# Patient Record
Sex: Male | Born: 1980 | Race: White | Hispanic: Yes | Marital: Single | Smoking: Never smoker
Health system: Southern US, Community
[De-identification: ages and names within clinical notes are randomized; demographics above are authoritative.]

## PROBLEM LIST (undated history)

## (undated) HISTORY — PX: KNEE SURGERY: SHX244

---

## 2007-07-24 ENCOUNTER — Emergency Department (HOSPITAL_COMMUNITY): Admission: EM | Admit: 2007-07-24 | Discharge: 2007-07-24 | Payer: Self-pay | Admitting: Emergency Medicine

## 2009-08-05 ENCOUNTER — Emergency Department (HOSPITAL_COMMUNITY): Admission: EM | Admit: 2009-08-05 | Discharge: 2009-08-05 | Payer: Self-pay | Admitting: Emergency Medicine

## 2011-04-30 ENCOUNTER — Emergency Department (HOSPITAL_COMMUNITY): Payer: Worker's Compensation

## 2011-04-30 ENCOUNTER — Emergency Department (HOSPITAL_COMMUNITY)
Admission: EM | Admit: 2011-04-30 | Discharge: 2011-05-01 | Disposition: A | Discharge status: Home / Self Care | Payer: Worker's Compensation | Attending: Emergency Medicine | Admitting: Emergency Medicine

## 2011-04-30 DIAGNOSIS — IMO0002 Reserved for concepts with insufficient information to code with codable children: Secondary | ICD-10-CM | POA: Insufficient documentation

## 2011-04-30 DIAGNOSIS — M25569 Pain in unspecified knee: Secondary | ICD-10-CM | POA: Insufficient documentation

## 2011-04-30 DIAGNOSIS — S70259A Superficial foreign body, unspecified hip, initial encounter: Secondary | ICD-10-CM | POA: Insufficient documentation

## 2011-04-30 DIAGNOSIS — S8990XA Unspecified injury of unspecified lower leg, initial encounter: Secondary | ICD-10-CM | POA: Insufficient documentation

## 2011-04-30 LAB — DIFFERENTIAL
Basophils Absolute: 0 10*3/uL (ref 0.0–0.1)
Eosinophils Absolute: 0.1 10*3/uL (ref 0.0–0.7)
Eosinophils Relative: 2 % (ref 0–5)
Monocytes Absolute: 0.4 10*3/uL (ref 0.1–1.0)

## 2011-04-30 LAB — CBC
HCT: 39.7 % (ref 39.0–52.0)
MCHC: 34.5 g/dL (ref 30.0–36.0)
MCV: 84.8 fL (ref 78.0–100.0)
Platelets: 203 10*3/uL (ref 150–400)
RDW: 13.2 % (ref 11.5–15.5)
WBC: 7.9 10*3/uL (ref 4.0–10.5)

## 2011-05-01 ENCOUNTER — Emergency Department (HOSPITAL_COMMUNITY): Payer: Worker's Compensation

## 2011-05-23 NOTE — Consult Note (Signed)
  NAMEJAMY, CLECKLER NO.:  [id number]  MEDICAL RECORD NO.:  [id number]  LOCATION:  [facility]                         FACILITY:  [facility]  PHYSICIAN:  Burnard Bunting, M.D.    DATE OF BIRTH:  1981/04/03  DATE OF CONSULTATION:  04/30/2011 DATE OF DISCHARGE:                                CONSULTATION   CHIEF COMPLAINT:  Right knee pain.  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 Denies any other orthopedic complaints.  His tetanus status is unknown, but it is provided in the emergency room.  PAST MEDICAL AND SURGICAL HISTORY:  Negative.  The patient speaks very little [place].  This communication effort is complicated today, need for translator who is here in the emergency room.  CURRENT MEDICATIONS:  None.  ALLERGIES:  None.  All systems reviewed are negative as they relate to the right knee. PHYSICAL EXAMINATION:  GENERAL:  He is well developed, well-nourished in no acute distress, alert and oriented. VITAL SIGNS:  Blood pressure 118/80, pulse 63, respirations 16. CHEST:  Clear to auscultation. CARDIAC:  Heart beats regular rate and rhythm. ABDOMEN:  Benign. EXTREMITIES:  Right knee stapled at the superior pole of patella.  The transverse portion of staple is visible.  Patella is nonmobile.  There is no knee effusion.  Collateral cruciate ligament stable.  Pedal pulses are intact.  Compartments are soft and leg.  Radiographs show a foreign body.  IMPRESSION:  Right knee foreign body likely implanted superior pole of patella.  Extensor mechanism does appear to be intact.  Attempt was made in the emergency room to remove without success.  Plan is for operative debridement, Ancef 2 g given in the emergency room.  All questions answered.  Medical decision-making is complicated by decision for  surgery as well as a communication barrier.     Burnard Bunting, M.D.     GSD/MEDQ  D:  04/30/2011  T:  05/01/2011  Job:  [id number]  cc:   Trudi Ida. Denton Lank, M.D.  Electronically Signed by Reece Agar.  Izzah Pasqua M.D. on 05/23/2011 05:25:45 PM

## 2011-05-23 NOTE — Op Note (Signed)
  NAMEMAZE, CORNIEL NO.:  [id number]  MEDICAL RECORD NO.:  [id number]  LOCATION:  [facility]                         FACILITY:  [facility]  PHYSICIAN:  Burnard Bunting, M.D.    DATE OF BIRTH:  April 24, 1981  DATE OF PROCEDURE:  05/01/2011 DATE OF DISCHARGE:  05/01/2011                              OPERATIVE REPORT   PREOPERATIVE DIAGNOSIS:  Right knee foreign body with intra-articular penetration.  POSTOPERATIVE DIAGNOSIS:  Right knee foreign body with intra-articular penetration.  PROCEDURE:  Right knee removal of foreign body; excisional debridement of skin, subcu tissue, muscle fascia, bone from penetrating injury to superior aspect of the patella as well as irrigation and debridement and washout of the knee joint.  SURGEON:  Burnard Bunting, MD  ASSISTANT:  None.  ANESTHESIA:  General endotracheal.  ESTIMATED BLOOD LOSS:  Minimal.  INDICATIONS:  Tristan Owens is a 30 year old patient with right knee pain.  He shot himself with a staple gun today in the superior aspect of the knee.  He presents now for operative management.  He did receive tetanus and IV antibiotics in the emergency room.  PROCEDURE IN DETAIL:  The patient was brought to the operating room where general endotracheal anesthesia was induced.  Preoperative IV antibiotics were administered.  Leg area was prescrubbed with alcohol and Betadine and prepped with DuraPrep solution including a foot drape in a sterile manner.  Time-out was called.  Skin incision was made around the nail.  The staple was embedded within the patella.  This took some force to remove.  It was removed in total.  At this time, using a curette and sharp dissection, the path of the staple was debrided. Thorough irrigation was then performed on this incision which was closed loosely at the end of the case using 3-0 nylon suture.  An aspiration of the knee joint effusion was performed.  It was bloody, therefore  intra- articular penetration had occurred.  Two arthroscopic portals were created superomedial and superolateral.  Three liters of irrigating solution were then used to irrigate out the knee.  These portals were closed using 3-0 nylon.  At this time, the knee was stable.  It was wrapped with a bulky dressing. He tolerated the procedure without immediate complication.     Burnard Bunting, M.D.     GSD/MEDQ  D:  05/01/2011  T:  05/01/2011  Job:  [id number]  Electronically Signed by Reece Agar.  DEAN M.D. on 05/23/2011 05:25:49 PM

## 2011-06-13 ENCOUNTER — Ambulatory Visit: Payer: Self-pay | Attending: Orthopedic Surgery | Admitting: Physical Therapy

## 2011-06-13 DIAGNOSIS — IMO0001 Reserved for inherently not codable concepts without codable children: Secondary | ICD-10-CM | POA: Insufficient documentation

## 2011-06-13 DIAGNOSIS — M25569 Pain in unspecified knee: Secondary | ICD-10-CM | POA: Insufficient documentation

## 2011-06-13 DIAGNOSIS — M25669 Stiffness of unspecified knee, not elsewhere classified: Secondary | ICD-10-CM | POA: Insufficient documentation

## 2011-06-25 ENCOUNTER — Ambulatory Visit: Payer: Self-pay | Admitting: Physical Therapy

## 2011-06-26 ENCOUNTER — Ambulatory Visit: Payer: Self-pay | Admitting: Physical Therapy

## 2011-07-07 ENCOUNTER — Ambulatory Visit: Payer: Self-pay | Admitting: Physical Therapy

## 2011-07-10 ENCOUNTER — Ambulatory Visit: Payer: Self-pay | Admitting: Physical Therapy

## 2011-07-15 ENCOUNTER — Ambulatory Visit: Payer: Worker's Compensation | Attending: Orthopedic Surgery | Admitting: Physical Therapy

## 2011-07-15 DIAGNOSIS — M25669 Stiffness of unspecified knee, not elsewhere classified: Secondary | ICD-10-CM | POA: Insufficient documentation

## 2011-07-15 DIAGNOSIS — IMO0001 Reserved for inherently not codable concepts without codable children: Secondary | ICD-10-CM | POA: Insufficient documentation

## 2011-07-15 DIAGNOSIS — M25569 Pain in unspecified knee: Secondary | ICD-10-CM | POA: Insufficient documentation

## 2011-07-17 ENCOUNTER — Ambulatory Visit: Payer: Worker's Compensation | Admitting: Physical Therapy

## 2011-07-21 ENCOUNTER — Ambulatory Visit: Payer: Worker's Compensation | Admitting: Physical Therapy

## 2011-07-23 ENCOUNTER — Encounter: Payer: Self-pay | Admitting: Physical Therapy

## 2011-07-28 ENCOUNTER — Ambulatory Visit: Payer: Worker's Compensation | Admitting: Physical Therapy

## 2011-07-30 ENCOUNTER — Ambulatory Visit: Payer: Worker's Compensation | Admitting: Physical Therapy

## 2011-08-05 ENCOUNTER — Ambulatory Visit: Payer: Worker's Compensation | Admitting: Physical Therapy

## 2011-11-02 IMAGING — CR DG CHEST 2V
1 series · 1 of 1 positions shown · non-contrast
Comparison: None

CLINICAL DATA: Preoperative assessment for foreign body removal
from the knee; patient does not speak English, unable to obtain
additional history

CHEST - 2 VIEW

[view not recorded]
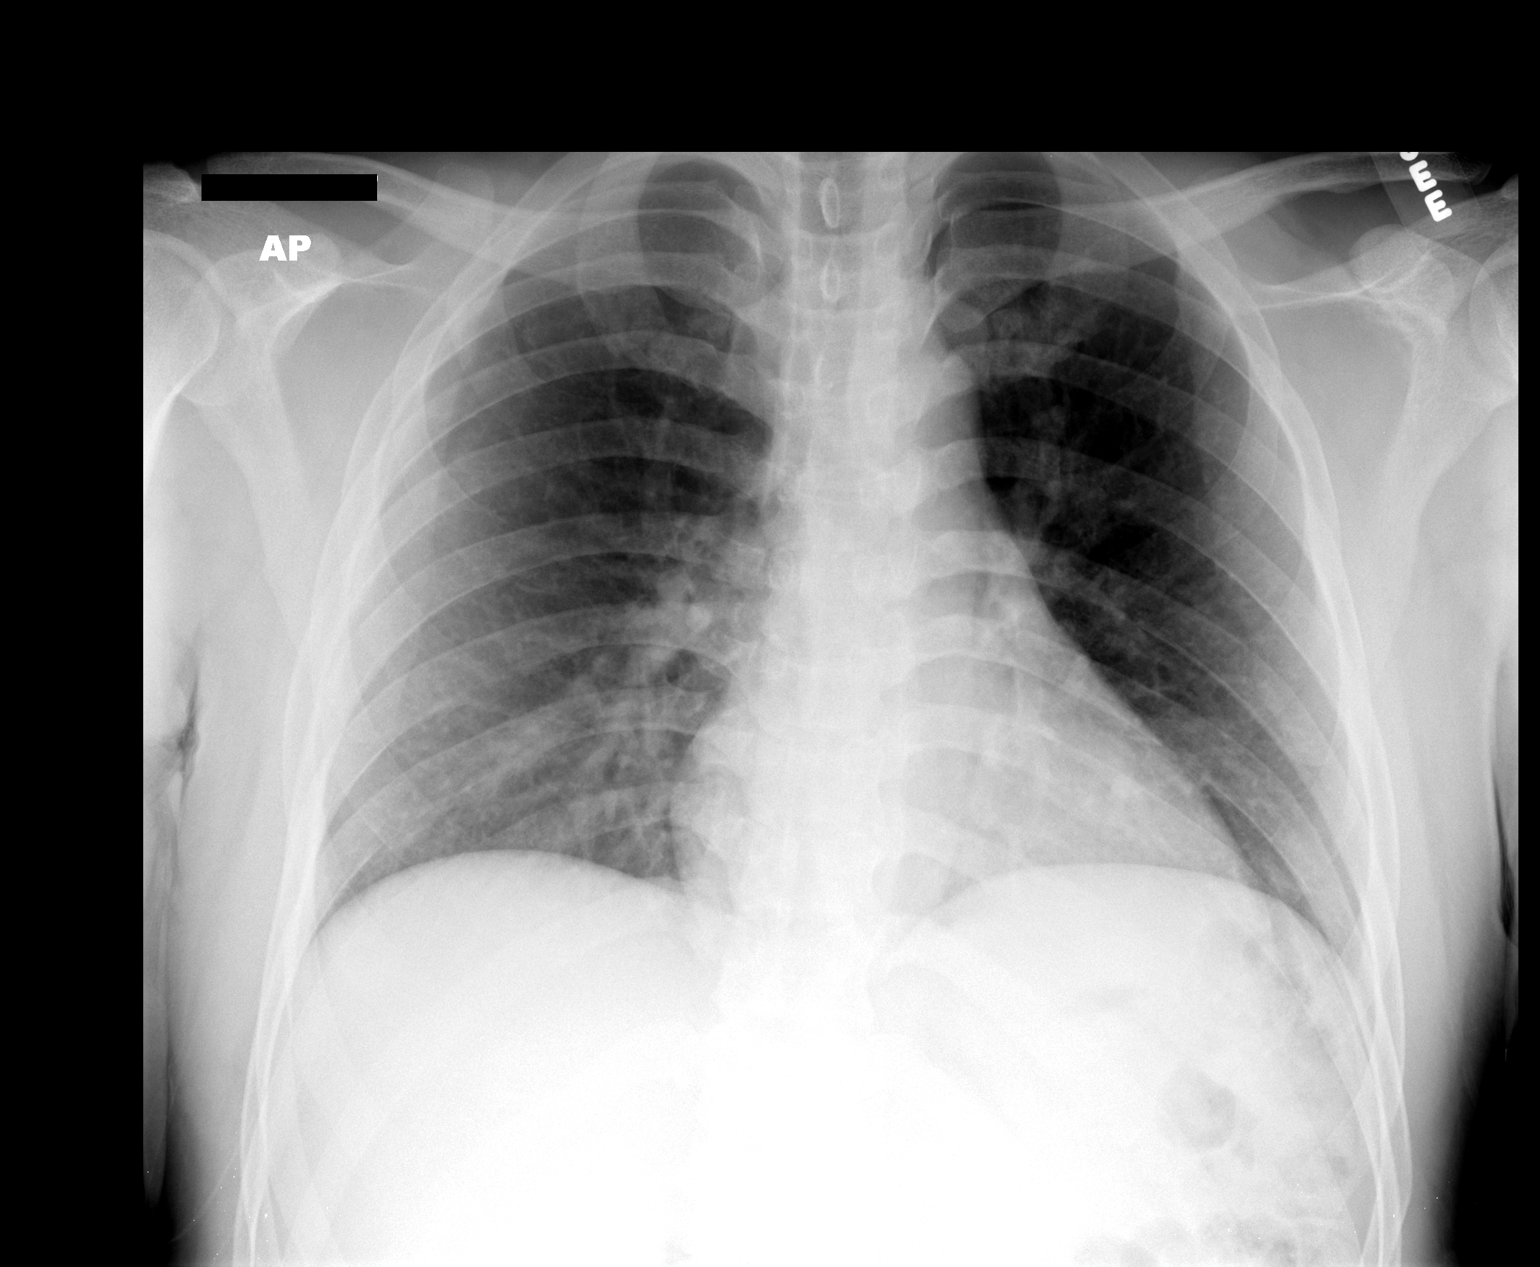

[1 of 1 positions shown; findings below may reference images not displayed]

FINDINGS: Normal heart size, mediastinal contours, pulmonary vascularity for
technique.
Questionable subtle right base infiltrate versus atelectasis.
Remaining lungs clear.
No pleural effusion or pneumothorax.
Bones unremarkable.
IMPRESSION: Questionable mild atelectasis or infiltrate right base.

## 2011-11-03 IMAGING — CR DG KNEE 1-2V PORT*R*
2 series · 2 of 2 positions shown · non-contrast
Comparison: Plain films of the right knee earlier this same date.

CLINICAL DATA: Status post removal of foreign body.

PORTABLE RIGHT KNEE - 1-2 VIEW

[ap/obl knee]
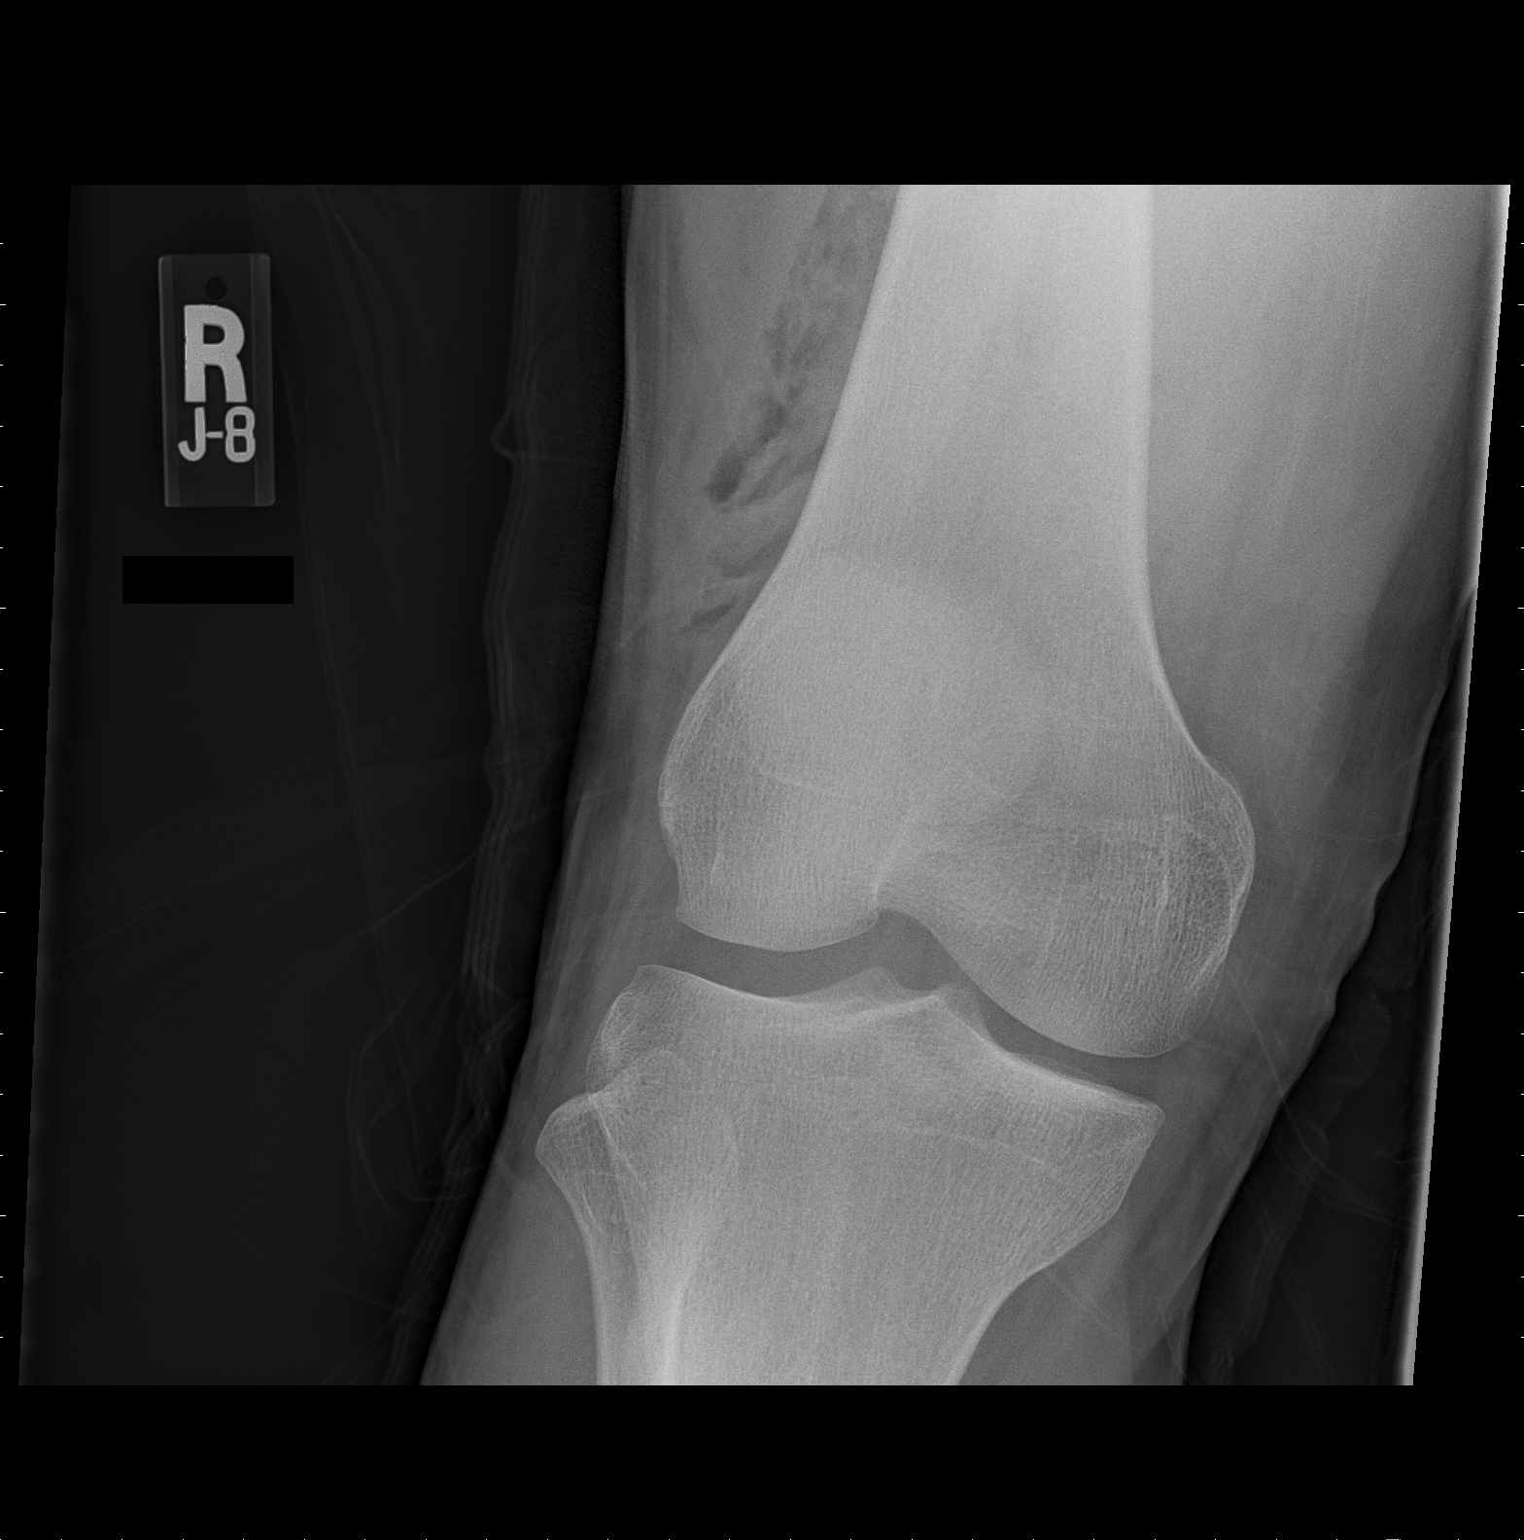

[knee lat]
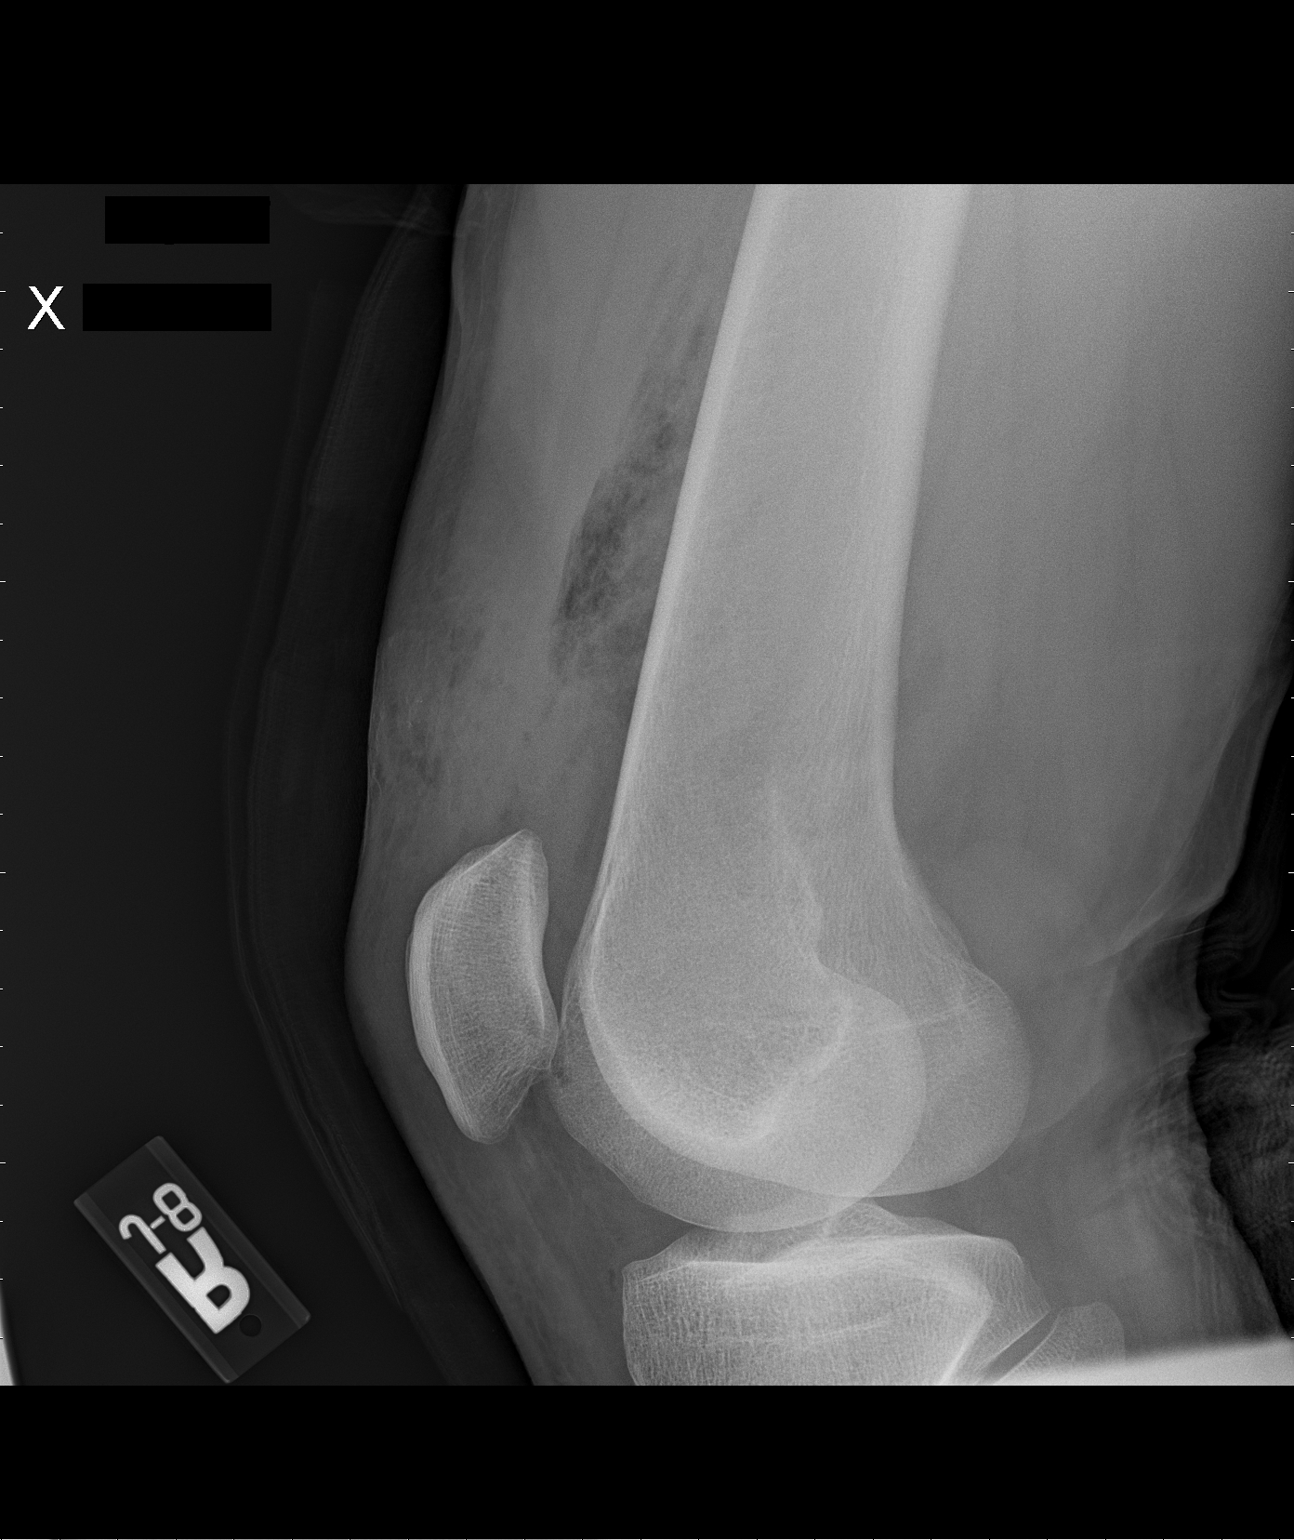

[2 of 2 positions shown; findings below may reference images not displayed]

FINDINGS: There is gas in the soft tissues from surgery.
Previously seen radiopaque foreign body superior to the patella is
no longer visualized.  No retained foreign body is seen.  No
fracture.
IMPRESSION: Removal of foreign body.  No acute abnormality.

## 2013-05-14 ENCOUNTER — Encounter (HOSPITAL_COMMUNITY): Payer: Self-pay | Admitting: Emergency Medicine

## 2013-05-14 ENCOUNTER — Emergency Department (HOSPITAL_COMMUNITY)
Admission: EM | Admit: 2013-05-14 | Discharge: 2013-05-14 | Disposition: A | Discharge status: Home / Self Care | Payer: Self-pay | Attending: Emergency Medicine | Admitting: Emergency Medicine

## 2013-05-14 DIAGNOSIS — Z79899 Other long term (current) drug therapy: Secondary | ICD-10-CM | POA: Insufficient documentation

## 2013-05-14 DIAGNOSIS — H9202 Otalgia, left ear: Secondary | ICD-10-CM

## 2013-05-14 DIAGNOSIS — R05 Cough: Secondary | ICD-10-CM | POA: Insufficient documentation

## 2013-05-14 DIAGNOSIS — J029 Acute pharyngitis, unspecified: Secondary | ICD-10-CM | POA: Insufficient documentation

## 2013-05-14 DIAGNOSIS — H9209 Otalgia, unspecified ear: Secondary | ICD-10-CM | POA: Insufficient documentation

## 2013-05-14 DIAGNOSIS — R059 Cough, unspecified: Secondary | ICD-10-CM | POA: Insufficient documentation

## 2013-05-14 MED ORDER — CETIRIZINE-PSEUDOEPHEDRINE ER 5-120 MG PO TB12: 1.0000 | ORAL_TABLET | Freq: Two times a day (BID) | ORAL | Status: AC

## 2013-05-14 MED ORDER — AMOXICILLIN 500 MG PO CAPS: 500.0000 mg | ORAL_CAPSULE | Freq: Two times a day (BID) | ORAL | Status: AC

## 2013-05-14 NOTE — ED Notes (Signed)
LEft Ear pain started on Wednesday. Throat aching and cannot hear out of the left ear.

## 2013-05-14 NOTE — ED Provider Notes (Signed)
History    CSN: [id number] Arrival date & time 05/14/13  4098  First MD Initiated Contact with Patient 05/14/13 0831     Chief Complaint  Patient presents with  . Otalgia   (Consider location/radiation/quality/duration/timing/severity/associated sxs/prior Treatment) HPI Comments: Patient presents with 3-4 days of sore throat and left ear pain. No fever or head injury. No congestion or runny nose. Patient has had cough. No nausea , vomiting, diarrhea. Patient has had Robitussin prior to arrival. No other treatments. No significant past medical history. Onset of symptoms gradual. Course is constant. Nothing makes symptoms better or worse.  The history is provided by the patient and a friend. A language interpreter was used.   No past medical history on file. No past surgical history on file. No family history on file. History  Substance Use Topics  . Smoking status: Never Smoker   . Smokeless tobacco: Not on file  . Alcohol Use: 7.2 oz/week    12 Cans of beer per week    Review of Systems  Constitutional: Negative for fever, chills and fatigue.  HENT: Positive for ear pain and sore throat. Negative for congestion, rhinorrhea, neck stiffness and sinus pressure.   Eyes: Negative for redness.  Respiratory: Positive for cough. Negative for wheezing.   Gastrointestinal: Negative for nausea, vomiting, abdominal pain and diarrhea.  Genitourinary: Negative for dysuria.  Musculoskeletal: Negative for myalgias.  Skin: Negative for rash.  Neurological: Negative for headaches.  Hematological: Negative for adenopathy.    Allergies  Review of patient's allergies indicates no known allergies.  Home Medications   Current Outpatient Rx  Name  Route  Sig  Dispense  Refill  . Pseudoephedrine-DM-GG (ROBITUSSIN CF PO)   Oral   Take by mouth every 6 (six) hours as needed.          BP 119/77  Pulse 75  Temp(Src) 98.4 F (36.9 C) (Oral)  Wt 184 lb (83.462 kg)  SpO2 96% Physical  Exam  Nursing note and vitals reviewed. Constitutional: He appears well-developed and well-nourished.  HENT:  Head: Normocephalic and atraumatic.  Right Ear: Tympanic membrane, external ear and ear canal normal. Tympanic membrane is not injected.  Left Ear: External ear and ear canal normal. Tympanic membrane is injected (mild, pink) and retracted (mild). Tympanic membrane is not bulging.  Mouth/Throat: Mucous membranes are normal. No edematous. Posterior oropharyngeal erythema present. No oropharyngeal exudate, posterior oropharyngeal edema or tonsillar abscesses.  Eyes: Conjunctivae are normal. Right eye exhibits no discharge. Left eye exhibits no discharge.  Neck: Normal range of motion. Neck supple.  Cardiovascular: Normal rate, regular rhythm and normal heart sounds.   Pulmonary/Chest: Effort normal and breath sounds normal.  Abdominal: Soft. There is no tenderness.  Neurological: He is alert.  Skin: Skin is warm and dry.  Psychiatric: He has a normal mood and affect.    ED Course  Procedures (including critical care time) Labs Reviewed - No data to display No results found. 1. Otalgia of left ear     8:50 AM Patient seen and examined.   Vital signs reviewed and are as follows: Filed Vitals:   05/14/13 0832  BP: 119/77  Pulse: 75  Temp: 98.4 F (36.9 C)   Pt counseled on wait and see regarding antibiotics. Encouraged to use decongestants for several days and monitor for improvement.   Patient urged to return with worsening symptoms or other concerns. Patient verbalized understanding and agrees with plan.    MDM  Ear pain: mild retraction of  TM, mild pink coloration of TM. Might indicate early otitis media but eustachian tube dysfunction in setting of viral illness (sore throat) might cause this as well. Encouraged decongestants prior to filling abx. ENT referral if no improvement.   Renne Crigler, PA-C 05/14/13 [phone]

## 2013-05-14 NOTE — ED Provider Notes (Signed)
Medical screening examination/treatment/procedure(s) were performed by non-physician practitioner and as supervising physician I was immediately available for consultation/collaboration.   Johnnette Laux L Maryjane Benedict, MD 05/14/13 2046 

## 2013-11-30 ENCOUNTER — Emergency Department (HOSPITAL_COMMUNITY)
Admission: EM | Admit: 2013-11-30 | Discharge: 2013-11-30 | Disposition: A | Discharge status: Home / Self Care | Payer: Worker's Compensation | Attending: Emergency Medicine | Admitting: Emergency Medicine

## 2013-11-30 ENCOUNTER — Encounter (HOSPITAL_COMMUNITY): Payer: Self-pay | Admitting: Emergency Medicine

## 2013-11-30 DIAGNOSIS — W540XXA Bitten by dog, initial encounter: Secondary | ICD-10-CM | POA: Insufficient documentation

## 2013-11-30 DIAGNOSIS — S61459A Open bite of unspecified hand, initial encounter: Secondary | ICD-10-CM

## 2013-11-30 DIAGNOSIS — Y9389 Activity, other specified: Secondary | ICD-10-CM | POA: Insufficient documentation

## 2013-11-30 DIAGNOSIS — S61209A Unspecified open wound of unspecified finger without damage to nail, initial encounter: Secondary | ICD-10-CM | POA: Insufficient documentation

## 2013-11-30 DIAGNOSIS — Z79899 Other long term (current) drug therapy: Secondary | ICD-10-CM | POA: Insufficient documentation

## 2013-11-30 DIAGNOSIS — Y929 Unspecified place or not applicable: Secondary | ICD-10-CM | POA: Insufficient documentation

## 2013-11-30 MED ORDER — TETANUS-DIPHTH-ACELL PERTUSSIS 5-2.5-18.5 LF-MCG/0.5 IM SUSP
0.5000 mL | Freq: Once | INTRAMUSCULAR | Status: DC
  Filled 2013-11-30: qty 0.5

## 2013-11-30 MED ORDER — AMOXICILLIN-POT CLAVULANATE 875-125 MG PO TABS: 1.0000 | ORAL_TABLET | Freq: Two times a day (BID) | ORAL | Status: AC

## 2013-11-30 NOTE — ED Notes (Signed)
Patient has an animal bite to his left index finger at the knuckle. No bleeding at this time. Swollen and red.

## 2013-11-30 NOTE — ED Provider Notes (Signed)
Medical screening examination/treatment/procedure(s) were performed by non-physician practitioner and as supervising physician I was immediately available for consultation/collaboration.      Celene KrasJon R Kitai Purdom, MD 11/30/13 [phone]

## 2013-11-30 NOTE — ED Provider Notes (Signed)
CSN: [id number]     Arrival date & time 11/30/13  1049 History  This chart was scribed for non-physician practitioner Santiago Glad, PA-C working with Celene Kras, MD by Dorothey Baseman, ED Scribe. This patient was seen in room TR05C/TR05C and the patient's care was started at 11:47 AM.    Chief Complaint  Patient presents with  . Animal Bite   The history is provided by the patient. The history is limited by a language barrier. No language interpreter was used.   HPI Comments: Tristan Owens is a 33 y.o. Male who presents to the Emergency Department complaining of a dog bite to the left index finger and thumb that occurred earlier today. He states that he was bitten by his neighbor's dog when it attacked his own dog and he attempted to break them up. Patient reports a constant pain to the area with associated swelling and laceration. There is no active bleeding at this time. He states that he does not know the vaccination status of the dog, but that the dog is in animal control custody. He also states that his dog is currently at a veterinary clinic. He states that his last tetanus vaccination was 3-4 years ago. He denies any numbness or tingling of the fingers.  History reviewed. No pertinent past medical history. History reviewed. No pertinent past surgical history. History reviewed. No pertinent family history. History  Substance Use Topics  . Smoking status: Never Smoker   . Smokeless tobacco: Not on file  . Alcohol Use: 7.2 oz/week    12 Cans of beer per week    Review of Systems  A complete 10 system review of systems was obtained and all systems are negative except as noted in the HPI and PMH.   Allergies  Review of patient's allergies indicates no known allergies.  Home Medications   Current Outpatient Rx  Name  Route  Sig  Dispense  Refill  . amoxicillin (AMOXIL) 500 MG capsule   Oral   Take 1 capsule (500 mg total) by mouth 2 (two) times daily.   14 capsule   0   . cetirizine-pseudoephedrine (ZYRTEC-D) 5-120 MG per tablet   Oral   Take 1 tablet by mouth 2 (two) times daily.   20 tablet   0   . Pseudoephedrine-DM-GG (ROBITUSSIN CF PO)   Oral   Take by mouth every 6 (six) hours as needed.          Triage Vitals: BP 148/105  Pulse 106  Temp(Src) 98.1 F (36.7 C)  Resp 20  Wt 195 lb (88.451 kg)  SpO2 99%  Physical Exam  Nursing note and vitals reviewed. Constitutional: He appears well-developed and well-nourished.  HENT:  Head: Normocephalic and atraumatic.  Mouth/Throat: Oropharynx is clear and moist.  Eyes: EOM are normal.  Neck: Normal range of motion. Neck supple.  Cardiovascular: Normal rate, regular rhythm and normal heart sounds.   Pulmonary/Chest: Effort normal and breath sounds normal. He has no wheezes.  Musculoskeletal: Normal range of motion.  Neurological: He is alert.  Distal sensation of the left thumb and left index finger is intact  Skin: Skin is warm and dry.  2 cm V-shaped superficial laceration laceration of his left index finger, just proximal to the PIP joint. 5 mm superficial linear laceration of the left thumb, just proximal to the DIP joint. No active bleeding. No surrounding erythema or edema. Full range of motion of his MCP, PIP, and DIP of the left index finger.  Full range of motion of MCP and DIP of the left thumb. Good strength against resistance.  Psychiatric: He has a normal mood and affect. His behavior is normal.    ED Course  Procedures (including critical care time)  DIAGNOSTIC STUDIES: Oxygen Saturation is 99% on room air, normal by my interpretation.    COORDINATION OF CARE: 11:55 AM- Discussed that the lacerations will not need to be repaired with sutures. Will clean the area and apply a dressing. Will discharge patient with Augmentin. Discussed treatment plan with patient at bedside and patient verbalized agreement.     Labs Review Labs Reviewed - No data to display Imaging Review No  results found.  EKG Interpretation   None       MDM  No diagnosis found. Patient presents after a dog bite.  The dog is currently in custody of Animal Control.  Therefore, rabies vaccine not given.  Patient's tetanus is UTD.  Lacerations appear superficial.  Lacerations irrigated well.  No signs of infection at this time.  No signs of tendon damage.  Full ROM of fingers.  Patient is neurovascularly intact.   Patient stable for discharge.  Patient discharged home with Rx for Augmentin.  Return precautions given.    Santiago GladHeather Zayonna Ayuso, PA-C 11/30/13 1520

## 2013-11-30 NOTE — ED Notes (Signed)
Bit by dog to left hand, sts unknown vaccination status.

## 2015-08-09 ENCOUNTER — Ambulatory Visit: Payer: Self-pay

## 2015-11-04 ENCOUNTER — Encounter (HOSPITAL_COMMUNITY): Payer: Self-pay | Admitting: Vascular Surgery

## 2015-11-04 ENCOUNTER — Emergency Department (HOSPITAL_COMMUNITY)
Admission: EM | Admit: 2015-11-04 | Discharge: 2015-11-04 | Disposition: A | Discharge status: Home / Self Care | Payer: Worker's Compensation | Attending: Emergency Medicine | Admitting: Emergency Medicine

## 2015-11-04 DIAGNOSIS — G510 Bell's palsy: Secondary | ICD-10-CM

## 2015-11-04 DIAGNOSIS — Z79899 Other long term (current) drug therapy: Secondary | ICD-10-CM | POA: Insufficient documentation

## 2015-11-04 DIAGNOSIS — Z792 Long term (current) use of antibiotics: Secondary | ICD-10-CM | POA: Insufficient documentation

## 2015-11-04 MED ORDER — HYPROMELLOSE (GONIOSCOPIC) 2.5 % OP SOLN: 1.0000 [drp] | Freq: Three times a day (TID) | OPHTHALMIC | Status: DC | PRN

## 2015-11-04 MED ORDER — PREDNISONE 20 MG PO TABS: ORAL_TABLET | ORAL | Status: DC

## 2015-11-04 MED ORDER — ACYCLOVIR 400 MG PO TABS: 400.0000 mg | ORAL_TABLET | Freq: Every day | ORAL | Status: DC

## 2015-11-04 NOTE — ED Notes (Signed)
Pt reports to the ED for eval of left sided facial droop x 2 days. Grips equal and no arm drift. Complete paralysis of left side of face. Unable to completely close left eye. Pt report pain to the left ear and face as well. Pt A&Ox4, resp e/u, and skin warm and dry.

## 2015-11-04 NOTE — Discharge Instructions (Signed)
Take prednisone and acyclovir as prescribed.   Use artifical tears to left eye three times daily.   Please make sure you put an eye patch on the left eye at night so it doesn't dry out.   See your doctor.   Return to ER if you have fever, headache, vomiting, blurry vision.    Parlisis facial (Bell Palsy) La parlisis facial es una afeccin en la que se paralizan los msculos de un lado de la cara. Esto a menudo provoca la cada de un lado de la cara. Es una afeccin frecuente y Games developerla mayora de las personas se recuperan por completo. FACTORES DE RIESGO Los factores de riesgo de la parlisis facial incluyen:  Psychiatristmbarazo.  Diabetes.  Una infeccin provocada por un virus, como las infecciones que causan llagas peribucales. CAUSAS  La parlisis facial es ocasionada por un dao o una inflamacin de un nervio de la cara. No est claro por qu sucede, pero puede ocurrir a causa de una infeccin provocada por un virus. La mayora de las veces se desconoce la causa. Blake DivineSIGNOS Y SNTOMAS  Los sntomas pueden variar de leves a graves y pueden durar varias horas. Entre los sntomas se pueden incluir los siguientes:  No poder Education officer, environmentalrealizar lo siguiente:  Surveyor, miningLevantar una o las dos cejas.  Cerrar Walgreenuno o los dos ojos.  Sentir partes de la cara (entumecimiento facial).  Cada del prpado y la comisura de la boca.  Debilidad en la cara.  Parlisis de la mitad de la cara.  Prdida del gusto.  Sensibilidad a los ruidos fuertes.  Dificultad para Product managermasticar.  Lagrimeo del ojo afectado.  Sequedad del ojo afectado.  Babeo.  Dolor detrs de Fiservuna oreja. DIAGNSTICO  El diagnstico de la parlisis facial puede incluir:  Un examen fsico y Neomia Dearuna historia clnica.  Una resonancia magntica.  Una tomografa computarizada.  Electromiograma (EMG). Esta prueba se realiza para controlar el funcionamiento de los nervios. TRATAMIENTO  El tratamiento puede incluir medicamentos antivirales para ayudar a  reducir la duracin de Astronomerla afeccin. En ocasiones, el tratamiento no es necesario y los sntomas desaparecen por s solos. INSTRUCCIONES PARA EL CUIDADO EN EL HOGAR   Tome los medicamentos solamente como se lo haya indicado el mdico.  Hgase masajes y realice los ejercicios faciales, como se lo haya indicado el mdico.  Si el ojo est afectado:  Use gotas oftlmicas con efecto hidratante para prevenir la sequedad del ojo, como se lo haya indicado el mdico.  Proteja el ojo como se lo haya indicado el mdico. SOLICITE ATENCIN MDICA SI:  Los sntomas no mejoran o empeoran.  Babea.  El ojo est rojo, irritado o le duele. SOLICITE ATENCIN MDICA DE INMEDIATO SI:   Siente otra parte del cuerpo dbil o adormecida.  Tiene dificultad para tragar.  Tiene fiebre adems de los sntomas de la parlisis facial.  Siente dolor en el cuello. ASEGRESE DE QUE:   Comprende estas instrucciones.  Controlar su afeccin.  Recibir ayuda de inmediato si no mejora o si empeora.   Esta informacin no tiene Theme park managercomo fin reemplazar el consejo del mdico. Asegrese de hacerle al mdico cualquier pregunta que tenga.   Document Released: 10/27/2005 Document Revised: 07/18/2015 Elsevier Interactive Patient Education [employer]2016 Elsevier Inc.

## 2015-11-04 NOTE — ED Provider Notes (Signed)
CSN: [id number]     Arrival date & time 11/04/15  1304 History   First MD Initiated Contact with Patient 11/04/15 1615     Chief Complaint  Patient presents with  . Facial Droop     (Consider location/radiation/quality/duration/timing/severity/associated sxs/prior Treatment) The history is provided by the patient.  Tristan Owens is a 34 y.o. male here with here presenting with left facial droop. Patient states that he can't close his eyes for the last 3 days. He states that his left face is droopy as well. Denies any trouble with his speech and denies any weakness in his arms or legs. Patient Has some pain in the left ear but denies any rash. Denies any fevers or chills or headaches. Patient brought by his friend who had Bell's palsy previously with similar symptoms.       History reviewed. No pertinent past medical history. Past Surgical History  Procedure Laterality Date  . Knee surgery Right    No family history on file. Social History  Substance Use Topics  . Smoking status: Never Smoker   . Smokeless tobacco: None  . Alcohol Use: 7.2 oz/week    12 Cans of beer per week     Comment: "every couple of days"    Review of Systems  Neurological:       L facial droop   All other systems reviewed and are negative.     Allergies  Review of patient's allergies indicates no known allergies.  Home Medications   Prior to Admission medications   Medication Sig Start Date End Date Taking? Authorizing Provider  amoxicillin (AMOXIL) 500 MG capsule Take 1 capsule (500 mg total) by mouth 2 (two) times daily. 05/14/13   Renne Crigler, PA-C  amoxicillin-clavulanate (AUGMENTIN) 875-125 MG per tablet Take 1 tablet by mouth 2 (two) times daily. 11/30/13   Heather Laisure, PA-C  cetirizine-pseudoephedrine (ZYRTEC-D) 5-120 MG per tablet Take 1 tablet by mouth 2 (two) times daily. 05/14/13   Renne Crigler, PA-C  Pseudoephedrine-DM-GG (ROBITUSSIN CF PO) Take by mouth every 6 (six) hours  as needed.    Historical Provider, MD   BP 118/76 mmHg  Pulse 86  Temp(Src) 98.3 F (36.8 C) (Oral)  Resp 14  SpO2 100% Physical Exam  Constitutional: He is oriented to person, place, and time. He appears well-developed and well-nourished.  HENT:  Head: Normocephalic.  Right Ear: External ear normal.  Left Ear: External ear normal.  Mouth/Throat: Oropharynx is clear and moist.  No obvious vesicles in external ear canal or on face   Eyes: Conjunctivae are normal. Pupils are equal, round, and reactive to light.  L conjunctiva slightly red   Neck: Normal range of motion. Neck supple.  Cardiovascular: Normal rate and regular rhythm.   Pulmonary/Chest: Effort normal and breath sounds normal. No respiratory distress. He has no wheezes. He has no rales.  Abdominal: Soft. Bowel sounds are normal. He exhibits no distension. There is no tenderness. There is no rebound.  Musculoskeletal: Normal range of motion.  Neurological: He is alert and oriented to person, place, and time.  Obvious L facial droop. Unable to raise L eyebrow. Unable to puff out cheek on L side. Nl strength throughout.   Skin: Skin is warm and dry.  Psychiatric: He has a normal mood and affect. His behavior is normal. Judgment and thought content normal.  Nursing note and vitals reviewed.   ED Course  Procedures (including critical care time) Labs Review Labs Reviewed - No data to display  Imaging Review No results found. I have personally reviewed and evaluated these images and lab results as part of my medical decision-making.   EKG Interpretation None      MDM   Final diagnoses:  None   Tristan Owens is a 34 y.o. male here with bell's palsy. No signs of stroke. His symptoms have been for 3 days so may be out of the window to benefit from treatment but friend really wants him to get steroids, antivirals. No vesicles on exam. Well appearing, no signs of encephalitis or meningitis. Recommend  artificial tears to L eye, eye patch at night, steroids, acyclovir.     Richardean Canalavid H Jadalee Westcott, MD 11/04/15 [phone]

## 2015-12-02 ENCOUNTER — Encounter (HOSPITAL_COMMUNITY): Payer: Self-pay | Admitting: Emergency Medicine

## 2015-12-02 ENCOUNTER — Emergency Department (HOSPITAL_COMMUNITY)
Admission: EM | Admit: 2015-12-02 | Discharge: 2015-12-02 | Disposition: A | Discharge status: Home / Self Care | Payer: Worker's Compensation | Attending: Emergency Medicine | Admitting: Emergency Medicine

## 2015-12-02 DIAGNOSIS — G510 Bell's palsy: Secondary | ICD-10-CM

## 2015-12-02 DIAGNOSIS — H11002 Unspecified pterygium of left eye: Secondary | ICD-10-CM | POA: Insufficient documentation

## 2015-12-02 DIAGNOSIS — Z792 Long term (current) use of antibiotics: Secondary | ICD-10-CM | POA: Insufficient documentation

## 2015-12-02 DIAGNOSIS — Z79899 Other long term (current) drug therapy: Secondary | ICD-10-CM | POA: Insufficient documentation

## 2015-12-02 MED ORDER — FLUORESCEIN SODIUM 1 MG OP STRP
1.0000 | ORAL_STRIP | Freq: Once | OPHTHALMIC | Status: AC
  Administered 2015-12-02: 1 via OPHTHALMIC
  Filled 2015-12-02: qty 1

## 2015-12-02 MED ORDER — TETRACAINE HCL 0.5 % OP SOLN
2.0000 [drp] | Freq: Once | OPHTHALMIC | Status: AC
  Administered 2015-12-02: 2 [drp] via OPHTHALMIC
  Filled 2015-12-02: qty 2

## 2015-12-02 NOTE — ED Provider Notes (Signed)
CSN: [id number]     Arrival date & time 12/02/15  1101 History   First MD Initiated Contact with Patient 12/02/15 1417     Chief Complaint  Patient presents with  . Facial Droop     (Consider location/radiation/quality/duration/timing/severity/associated sxs/prior Treatment) Patient is a 35 y.o. male presenting with general illness. The history is provided by the patient and a friend.  Illness Severity:  Moderate Onset quality:  Gradual Duration:  2 months Timing:  Constant Progression:  Unchanged Chronicity:  New Associated symptoms: no abdominal pain, no chest pain, no congestion, no diarrhea, no fever, no headaches, no myalgias, no rash, no shortness of breath and no vomiting    35 yo M  With a chief complaint of left-sided facial droop. This been going on since Christmas. Patient has had some left eye pain as well. Patient did a oral steroids as well as acyclovir without improvement. Has persistent palsy. Patient denies any other stroke symptoms. No headaches. No rash noted to the face.   History reviewed. No pertinent past medical history. Past Surgical History  Procedure Laterality Date  . Knee surgery Right    No family history on file. Social History  Substance Use Topics  . Smoking status: Never Smoker   . Smokeless tobacco: None  . Alcohol Use: 7.2 oz/week    12 Cans of beer per week     Comment: "every couple of days"    Review of Systems  Constitutional: Negative for fever and chills.  HENT: Negative for congestion and facial swelling.   Eyes: Negative for discharge and visual disturbance.  Respiratory: Negative for shortness of breath.   Cardiovascular: Negative for chest pain and palpitations.  Gastrointestinal: Negative for vomiting, abdominal pain and diarrhea.  Musculoskeletal: Negative for myalgias and arthralgias.  Skin: Negative for color change and rash.  Neurological: Positive for facial asymmetry. Negative for tremors, syncope and headaches.    Psychiatric/Behavioral: Negative for confusion and dysphoric mood.      Allergies  Review of patient's allergies indicates no known allergies.  Home Medications   Prior to Admission medications   Medication Sig Start Date End Date Taking? Authorizing Provider  acyclovir (ZOVIRAX) 400 MG tablet Take 1 tablet (400 mg total) by mouth 5 (five) times daily. 11/04/15   Richardean Canal, MD  amoxicillin (AMOXIL) 500 MG capsule Take 1 capsule (500 mg total) by mouth 2 (two) times daily. 05/14/13   Renne Crigler, PA-C  amoxicillin-clavulanate (AUGMENTIN) 875-125 MG per tablet Take 1 tablet by mouth 2 (two) times daily. 11/30/13   Heather Laisure, PA-C  cetirizine-pseudoephedrine (ZYRTEC-D) 5-120 MG per tablet Take 1 tablet by mouth 2 (two) times daily. 05/14/13   Renne Crigler, PA-C  hydroxypropyl methylcellulose / hypromellose (ISOPTO TEARS / GONIOVISC) 2.5 % ophthalmic solution Place 1 drop into the left eye 3 (three) times daily as needed for dry eyes. 11/04/15   Richardean Canal, MD  predniSONE (DELTASONE) 20 MG tablet Take 60 mg daily x 3 days then 40 mg daily x 3 days then 20 mg daily x 3 days 11/04/15   Richardean Canal, MD  Pseudoephedrine-DM-GG (ROBITUSSIN CF PO) Take by mouth every 6 (six) hours as needed.    Historical Provider, MD   BP 138/84 mmHg  Pulse 86  Temp(Src) 98.5 F (36.9 C) (Oral)  Resp 20  SpO2 96% Physical Exam  Constitutional: He is oriented to person, place, and time. He appears well-developed and well-nourished.  HENT:  Head: Normocephalic and atraumatic.  Eyes: EOM are normal. Pupils are equal, round, and reactive to light. Left eye exhibits no chemosis, no discharge and no exudate. No foreign body present in the left eye. Right conjunctiva is not injected. Left conjunctiva is not injected. Right eye exhibits normal extraocular motion and no nystagmus. Left eye exhibits normal extraocular motion and no nystagmus.    Neck: Normal range of motion. Neck supple. No JVD present.   Cardiovascular: Normal rate and regular rhythm.  Exam reveals no gallop and no friction rub.   No murmur heard. Pulmonary/Chest: No respiratory distress. He has no wheezes.  Abdominal: He exhibits no distension. There is no rebound and no guarding.  Musculoskeletal: Normal range of motion.  Neurological: He is alert and oriented to person, place, and time. He has normal strength. No cranial nerve deficit or sensory deficit. He displays a negative Romberg sign. Coordination and gait normal. GCS eye subscore is 4. GCS verbal subscore is 5. GCS motor subscore is 6. He displays no Babinski's sign on the right side. He displays no Babinski's sign on the left side.  Reflex Scores:      Tricep reflexes are 2+ on the right side and 2+ on the left side.      Bicep reflexes are 2+ on the right side and 2+ on the left side.      Brachioradialis reflexes are 2+ on the right side and 2+ on the left side.      Patellar reflexes are 2+ on the right side and 2+ on the left side.      Achilles reflexes are 2+ on the right side and 2+ on the left side.  Left-sided facial palsy including the forehead. Otherwise benign neuro exam  Skin: No rash noted. No pallor.  Psychiatric: He has a normal mood and affect. His behavior is normal.  Nursing note and vitals reviewed.   ED Course  Procedures (including critical care time) Labs Review Labs Reviewed - No data to display  Imaging Review No results found. I have personally reviewed and evaluated these images and lab results as part of my medical decision-making.   EKG Interpretation None      MDM   Final diagnoses:  None    35 yo M with persistent facial palsy. No other neuro deficits.   No noted uptake on fluroscein exam.Will have follow up with neurology.  Follow up with ophto for eye care. Tape eye shut at night.   3:35 PM:  I have discussed the diagnosis/risks/treatment options with the patient and family and believe the pt to be eligible for  discharge home to follow-up with PCP, neuro, optho. We also discussed returning to the ED immediately if new or worsening sx occur. We discussed the sx which are most concerning (e.g., sudden worsening symptoms, persistent eye pain. ) that necessitate immediate return. Medications administered to the patient during their visit and any new prescriptions provided to the patient are listed below.  Medications given during this visit Medications  fluorescein ophthalmic strip 1 strip (1 strip Left Eye Given 12/02/15 1511)  tetracaine (PONTOCAINE) 0.5 % ophthalmic solution 2 drop (2 drops Left Eye Given 12/02/15 1511)    Discharge Medication List as of 12/02/2015  3:18 PM      The patient appears reasonably screen and/or stabilized for discharge and I doubt any other medical condition or other [facility] requiring further screening, evaluation, or treatment in the ED at this time prior to discharge.  Melene Plan, DO 12/02/15 1535

## 2015-12-02 NOTE — Discharge Instructions (Signed)
Follow up with your doctor as well as neurology and optho.  Tape your eye closed at night.  Use eye drops throughout the day.  Parlisis facial (Bell Palsy) La parlisis facial es una afeccin en la que se paralizan los msculos de un lado de la cara. Esto a menudo provoca la cada de un lado de la cara. Es una afeccin frecuente y Games developer de las personas se recuperan por completo. FACTORES DE RIESGO Los factores de riesgo de la parlisis facial incluyen:  Psychiatrist.  Diabetes.  Una infeccin provocada por un virus, como las infecciones que causan llagas peribucales. CAUSAS  La parlisis facial es ocasionada por un dao o una inflamacin de un nervio de la cara. No est claro por qu sucede, pero puede ocurrir a causa de una infeccin provocada por un virus. La mayora de las veces se desconoce la causa. Tristan Owens SNTOMAS  Los sntomas pueden variar de leves a graves y pueden durar varias horas. Entre los sntomas se pueden incluir los siguientes:  No poder Education officer, environmental lo siguiente:  Surveyor, mining o las dos cejas.  Cerrar [employer] ojos.  Sentir partes de la cara (entumecimiento facial).  Cada del prpado y la comisura de la boca.  Debilidad en la cara.  Parlisis de la mitad de la cara.  Prdida del gusto.  Sensibilidad a los ruidos fuertes.  Dificultad para Product manager.  Lagrimeo del ojo afectado.  Sequedad del ojo afectado.  Babeo.  Dolor detrs de [employer]. DIAGNSTICO  El diagnstico de la parlisis facial puede incluir:  Un examen fsico y [name] historia clnica.  Una resonancia magntica.  Una tomografa computarizada.  Electromiograma (EMG). Esta prueba se realiza para controlar el funcionamiento de los nervios. TRATAMIENTO  El tratamiento puede incluir medicamentos antivirales para ayudar a reducir la duracin de Astronomer. En ocasiones, el tratamiento no es necesario y los sntomas desaparecen por s solos. INSTRUCCIONES PARA EL CUIDADO EN EL HOGAR     Tome los medicamentos solamente como se lo haya indicado el mdico.  Hgase masajes y realice los ejercicios faciales, como se lo haya indicado el mdico.  Si el ojo est afectado:  Use gotas oftlmicas con efecto hidratante para prevenir la sequedad del ojo, como se lo haya indicado el mdico.  Proteja el ojo como se lo haya indicado el mdico. SOLICITE ATENCIN MDICA SI:  Los sntomas no mejoran o empeoran.  Babea.  El ojo est rojo, irritado o le duele. SOLICITE ATENCIN MDICA DE INMEDIATO SI:   Siente otra parte del cuerpo dbil o adormecida.  Tiene dificultad para tragar.  Tiene fiebre adems de los sntomas de la parlisis facial.  Siente dolor en el cuello. ASEGRESE DE QUE:   Comprende estas instrucciones.  Controlar su afeccin.  Recibir ayuda de inmediato si no mejora o si empeora.   Esta informacin no tiene Theme park manager el consejo del mdico. Asegrese de hacerle al mdico cualquier pregunta que tenga.   Document Released: 10/27/2005 Document Revised: 07/18/2015 Elsevier Interactive Patient Education [employer]

## 2015-12-02 NOTE — ED Notes (Signed)
Pt here with c/o left sided facial droop onset on Christmas. Pt also c/o pain to left eye. Pt was told he had bell's palsy and if it didn't resolve to return. Pt didn't want to come.

## 2015-12-10 ENCOUNTER — Ambulatory Visit: Payer: Self-pay | Admitting: Neurology

## 2015-12-31 ENCOUNTER — Ambulatory Visit: Payer: Self-pay | Admitting: Neurology

## 2018-06-08 ENCOUNTER — Ambulatory Visit (HOSPITAL_COMMUNITY)
Admission: EM | Admit: 2018-06-08 | Discharge: 2018-06-08 | Disposition: A | Discharge status: Home / Self Care | Payer: Self-pay | Attending: Family Medicine | Admitting: Family Medicine

## 2018-06-08 ENCOUNTER — Encounter (HOSPITAL_COMMUNITY): Payer: Self-pay

## 2018-06-08 DIAGNOSIS — Z113 Encounter for screening for infections with a predominantly sexual mode of transmission: Secondary | ICD-10-CM

## 2018-06-08 DIAGNOSIS — Z202 Contact with and (suspected) exposure to infections with a predominantly sexual mode of transmission: Secondary | ICD-10-CM | POA: Insufficient documentation

## 2018-06-08 DIAGNOSIS — Z79899 Other long term (current) drug therapy: Secondary | ICD-10-CM | POA: Insufficient documentation

## 2018-06-08 DIAGNOSIS — N4889 Other specified disorders of penis: Secondary | ICD-10-CM | POA: Insufficient documentation

## 2018-06-08 LAB — POCT URINALYSIS DIP (DEVICE)
Glucose, UA: NEGATIVE mg/dL
HGB URINE DIPSTICK: NEGATIVE
KETONES UR: NEGATIVE mg/dL
Leukocytes, UA: NEGATIVE
Nitrite: NEGATIVE
PH: 6 (ref 5.0–8.0)
Protein, ur: 30 mg/dL — AB
Urobilinogen, UA: 1 mg/dL (ref 0.0–1.0)

## 2018-06-08 MED ORDER — AZITHROMYCIN 250 MG PO TABS
1000.0000 mg | ORAL_TABLET | Freq: Once | ORAL | Status: AC
  Administered 2018-06-08: 1000 mg via ORAL

## 2018-06-08 MED ORDER — CEFTRIAXONE SODIUM 250 MG IJ SOLR
INTRAMUSCULAR | Status: AC
  Filled 2018-06-08: qty 250

## 2018-06-08 MED ORDER — LIDOCAINE HCL (PF) 1 % IJ SOLN
INTRAMUSCULAR | Status: AC
  Filled 2018-06-08: qty 2

## 2018-06-08 MED ORDER — CEFTRIAXONE SODIUM 250 MG IJ SOLR
250.0000 mg | Freq: Once | INTRAMUSCULAR | Status: AC
  Administered 2018-06-08: 250 mg via INTRAMUSCULAR

## 2018-06-08 MED ORDER — AZITHROMYCIN 250 MG PO TABS
ORAL_TABLET | ORAL | Status: AC
  Filled 2018-06-08: qty 4

## 2018-06-08 NOTE — Discharge Instructions (Addendum)
Urine did not show signs of infection Given rocephin 250mg  injection and azithromycin 1g in office Urine cytology sent We will follow up with you regarding the results of your test If tests are positive, please abstain from sexual activity for at least 7 days and notify partners Follow up with PCP if symptoms persists   La orina no mostr signos de infeccin. Dada una inyeccin de 250 mg de rocefina y 1 g de azitromicina en el consultorio Citologa de orina enviada Le haremos un seguimiento con respecto a los [place]ados de su prueba. Si las pruebas son positivas, abstngase de la actividad sexual durante al menos 7 das y notifique a las Engineer, manufacturing systemsparejas Haga un seguimiento con PCP si los sntomas persisten Regrese aqu o vaya a la sala de emergencias si tiene algn sntoma nuevo o que Eudoraempeora Return here or go to ER if you have any new or worsening symptoms

## 2018-06-08 NOTE — ED Triage Notes (Signed)
Pt  Presents with penile pain and irritation

## 2018-06-08 NOTE — ED Provider Notes (Addendum)
[facility]   [id number] 06/08/18 Arrival Time: 0913   SUBJECTIVE: Tristan Owens ID # [id number] used to obtained hx   Per Beagley is a 37 y.o. male who presents with complaints of penile burning and itching.  Was sexually active with male partner around the time of his symptoms.  Patient is sexually active with 1 male partner.  Partner with similar symptoms.  Denies aggravating or alleviating factors.  Denies similar symptoms in the past.  Denies fever, chills, nausea, vomiting, abdominal pain, urinary symptoms, penile lesions, testicular pain, or testicular swelling.    No LMP for male patient.  ROS: As per HPI.  History reviewed. No pertinent past medical history. Past Surgical History:  Procedure Laterality Date  . KNEE SURGERY Right    No Known Allergies No current facility-administered medications on file prior to encounter.    Current Outpatient Medications on File Prior to Encounter  Medication Sig Dispense Refill  . acyclovir (ZOVIRAX) 400 MG tablet Take 1 tablet (400 mg total) by mouth 5 (five) times daily. 35 tablet 0  . amoxicillin (AMOXIL) 500 MG capsule Take 1 capsule (500 mg total) by mouth 2 (two) times daily. 14 capsule 0  . amoxicillin-clavulanate (AUGMENTIN) 875-125 MG per tablet Take 1 tablet by mouth 2 (two) times daily. 20 tablet 0  . cetirizine-pseudoephedrine (ZYRTEC-D) 5-120 MG per tablet Take 1 tablet by mouth 2 (two) times daily. 20 tablet 0  . hydroxypropyl methylcellulose / hypromellose (ISOPTO TEARS / GONIOVISC) 2.5 % ophthalmic solution Place 1 drop into the left eye 3 (three) times daily as needed for dry eyes. 15 mL 12  . predniSONE (DELTASONE) 20 MG tablet Take 60 mg daily x 3 days then 40 mg daily x 3 days then 20 mg daily x 3 days 18 tablet 0  . Pseudoephedrine-DM-GG (ROBITUSSIN CF PO) Take by mouth every 6 (six) hours as needed.      Social History   Socioeconomic History  . Marital status: Single    Spouse name: Not on  file  . Number of children: Not on file  . Years of education: Not on file  . Highest education level: Not on file  Occupational History  . Not on file  Social Needs  . Financial resource strain: Not on file  . Food insecurity:    Worry: Not on file    Inability: Not on file  . Transportation needs:    Medical: Not on file    Non-medical: Not on file  Tobacco Use  . Smoking status: Never Smoker  Substance and Sexual Activity  . Alcohol use: Yes    Alcohol/week: 7.2 oz    Types: 12 Cans of beer per week    Comment: "every couple of days"  . Drug use: No  . Sexual activity: Not on file  Lifestyle  . Physical activity:    Days per week: Not on file    Minutes per session: Not on file  . Stress: Not on file  Relationships  . Social connections:    Talks on phone: Not on file    Gets together: Not on file    Attends religious service: Not on file    Active member of club or organization: Not on file    Attends meetings of clubs or organizations: Not on file    Relationship status: Not on file  . Intimate partner violence:    Fear of current or ex partner: Not on file    Emotionally abused: Not  on file    Physically abused: Not on file    Forced sexual activity: Not on file  Other Topics Concern  . Not on file  Social History Narrative  . Not on file   History reviewed. No pertinent family history.  OBJECTIVE:  Vitals:   06/08/18 0940  BP: 121/82  Pulse: 64  Resp: 20  Temp: 98.5 F (36.9 C)  TempSrc: Oral  SpO2: 97%    Melissa W. CMA present during encounter  General appearance: alert, cooperative, appears stated age and no distress Throat: lips, mucosa, and tongue normal; teeth and gums normal Lungs: CTA bilaterally without adventitious breath sounds Heart: regular rate and rhythm.  Radial pulses 2+ symmetrical bilaterally GU: declines  Skin: warm and dry Psychological:  Alert and cooperative. Normal mood and affect.  ASSESSMENT & PLAN:  1. Penile  pain   2. STD exposure     Meds ordered this encounter  Medications  . azithromycin (ZITHROMAX) tablet 1,000 mg  . cefTRIAXone (ROCEPHIN) injection 250 mg    Pending: Labs Reviewed  POCT URINALYSIS DIP (DEVICE) - Abnormal; Notable for the following components:      Result Value   Bilirubin Urine SMALL (*)    Protein, ur 30 (*)    All other components within normal limits  URINE CYTOLOGY ANCILLARY ONLY   Urine did not show signs of infection Given rocephin 250mg  injection and azithromycin 1g in office Urine cytology sent We will follow up with you regarding the results of your test If tests are positive, please abstain from sexual activity for at least 7 days and notify partners Follow up with PCP if symptoms persists Return here or go to ER if you have any new or worsening symptoms    Reviewed expectations re: course of current medical issues. Questions answered. Outlined signs and symptoms indicating need for more acute intervention. Patient verbalized understanding. After Visit Summary given.   ADDENDUM:  Patient's significant other (SO) present during examination.  SO stated she felt "belittled" when [employer] Interpreter was used to obtained hx.  I told her it was part of [facility]'s Policy to use an interpreter who is trained to translate in a medical setting.  She became upset and stated she came from "a long line of physicians and DEAs," and proceeded to say she could "belittle me" and "smash" my career.  I apologized to patient's significant other for making her feel upset and told her that was not my intention.  Asked patient if he would like to have his SO present during the rest of the encounter and he stated it was up to her.  SO stayed.  SO appeared less agitated during the end of the encounter and was asking appropriate questions regarding the patient's care.  Melissa, CMA, was present.      Rennis HardingWurst, Levy Wellman, PA-C 06/09/18 1254

## 2018-06-09 LAB — URINE CYTOLOGY ANCILLARY ONLY
Chlamydia: NEGATIVE
Neisseria Gonorrhea: NEGATIVE
Trichomonas: NEGATIVE

## 2018-10-21 ENCOUNTER — Emergency Department (HOSPITAL_COMMUNITY)
Admission: EM | Admit: 2018-10-21 | Discharge: 2018-10-21 | Disposition: A | Discharge status: Home / Self Care | Payer: Self-pay | Attending: Emergency Medicine | Admitting: Emergency Medicine

## 2018-10-21 ENCOUNTER — Encounter (HOSPITAL_COMMUNITY): Payer: Self-pay

## 2018-10-21 ENCOUNTER — Other Ambulatory Visit: Payer: Self-pay

## 2018-10-21 DIAGNOSIS — S0501XA Injury of conjunctiva and corneal abrasion without foreign body, right eye, initial encounter: Secondary | ICD-10-CM | POA: Insufficient documentation

## 2018-10-21 DIAGNOSIS — Z79899 Other long term (current) drug therapy: Secondary | ICD-10-CM | POA: Insufficient documentation

## 2018-10-21 DIAGNOSIS — X58XXXA Exposure to other specified factors, initial encounter: Secondary | ICD-10-CM | POA: Insufficient documentation

## 2018-10-21 DIAGNOSIS — Y929 Unspecified place or not applicable: Secondary | ICD-10-CM | POA: Insufficient documentation

## 2018-10-21 DIAGNOSIS — Y939 Activity, unspecified: Secondary | ICD-10-CM | POA: Insufficient documentation

## 2018-10-21 DIAGNOSIS — Y999 Unspecified external cause status: Secondary | ICD-10-CM | POA: Insufficient documentation

## 2018-10-21 MED ORDER — ERYTHROMYCIN 5 MG/GM OP OINT
1.0000 "application " | TOPICAL_OINTMENT | OPHTHALMIC | Status: DC
  Administered 2018-10-21: 1 via OPHTHALMIC
  Filled 2018-10-21: qty 3.5

## 2018-10-21 MED ORDER — TETRACAINE HCL 0.5 % OP SOLN
2.0000 [drp] | Freq: Once | OPHTHALMIC | Status: AC
  Administered 2018-10-21: 2 [drp] via OPHTHALMIC
  Filled 2018-10-21: qty 4

## 2018-10-21 MED ORDER — FLUORESCEIN SODIUM 1 MG OP STRP
1.0000 | ORAL_STRIP | Freq: Once | OPHTHALMIC | Status: AC
  Administered 2018-10-21: 1 via OPHTHALMIC
  Filled 2018-10-21: qty 1

## 2018-10-21 NOTE — ED Triage Notes (Signed)
Pt endorses right eye pain/irritation since yesterday morning. Feels like something is in his eye possibly from work. VSS.

## 2018-10-21 NOTE — ED Provider Notes (Signed)
[facility] EMERGENCY DEPARTMENT Provider Note   CSN: [id number] Arrival date & time: 10/21/18  4098     History   Chief Complaint Chief Complaint  Patient presents with  . Eye Pain   [employer] Spanish interpreter was used HPI Tristan Owens is a 37 y.o. male with no significant past medical history presents emergency room today for right eye pain.  Patient reports that yesterday morning when he woke up he had pain in his right eyelid felt like sand is in his eye.  He notes some pain when looking from right to left.  He notes some redness, blurring of his vision as well as tearing.  He denies any photophobia.  Patient denies any trauma.  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 His tetanus is up to date.  Patient is not a contact lens wearer.  HPI  History reviewed. No pertinent past medical history.  There are no active problems to display for this patient.   Past Surgical History:  Procedure Laterality Date  . KNEE SURGERY Right         Home Medications    Prior to Admission medications   Medication Sig Start Date End Date Taking? Authorizing Provider  acyclovir (ZOVIRAX) 400 MG tablet Take 1 tablet (400 mg total) by mouth 5 (five) times daily. 11/04/15   Charlynne Pander, MD  amoxicillin (AMOXIL) 500 MG capsule Take 1 capsule (500 mg total) by mouth 2 (two) times daily. 05/14/13   Renne Crigler, PA-C  amoxicillin-clavulanate (AUGMENTIN) 875-125 MG per tablet Take 1 tablet by mouth 2 (two) times daily. 11/30/13   Santiago Glad, PA-C  cetirizine-pseudoephedrine (ZYRTEC-D) 5-120 MG per tablet Take 1 tablet by mouth 2 (two) times daily. 05/14/13   Renne Crigler, PA-C  hydroxypropyl methylcellulose / hypromellose (ISOPTO TEARS / GONIOVISC) 2.5 % ophthalmic solution Place 1 drop into the left eye 3 (three) times daily as needed for dry eyes. 11/04/15   Charlynne Pander, MD    predniSONE (DELTASONE) 20 MG tablet Take 60 mg daily x 3 days then 40 mg daily x 3 days then 20 mg daily x 3 days 11/04/15   Charlynne Pander, MD  Pseudoephedrine-DM-GG (ROBITUSSIN CF PO) Take by mouth every 6 (six) hours as needed.    [provider]    Family History History reviewed. No pertinent family history.  Social History Social History   Tobacco Use  . Smoking status: Never Smoker  Substance Use Topics  . Alcohol use: Yes    Alcohol/week: 12.0 standard drinks    Types: 12 Cans of beer per week    Comment: "every couple of days"  . Drug use: No     Allergies   Patient has no known allergies.   Review of Systems Review of Systems  Constitutional: Negative for fever.  HENT: Negative for congestion, ear pain, sinus pain and sore throat.   Eyes: Positive for pain, discharge and redness. Negative for photophobia.  Gastrointestinal: Negative for nausea and vomiting.  Musculoskeletal: Negative for neck stiffness.  Skin: Negative for rash.     Physical Exam Updated Vital Signs BP 133/88 (BP Location: Right Arm)   Pulse 75   Temp 98.3 F (36.8 C) (Oral)   Resp 18   Ht 5\' 8"  (1.727 m)   Wt 81.6 kg   SpO2 98%   BMI 27.37 kg/m   Physical Exam Vitals signs and  nursing note reviewed.  Constitutional:      Appearance: He is well-developed.  HENT:     Head: Normocephalic and atraumatic.     Right Ear: External ear normal.     Left Ear: External ear normal.  Eyes:     General: Lids are normal. Lids are everted, no foreign bodies appreciated. Vision grossly intact. No scleral icterus.       Right eye: Discharge (tearing, watery) present. No hordeolum.        Left eye: No discharge or hordeolum.     Extraocular Movements: Extraocular movements intact.     Right eye: Normal extraocular motion and no nystagmus.     Left eye: Normal extraocular motion and no nystagmus.     Conjunctiva/sclera:     Right eye: Right conjunctiva is injected.     Left eye:  Left conjunctiva is not injected.     Pupils: Pupils are equal, round, and reactive to light.     Right eye: Pupil is round, reactive and not sluggish. Corneal abrasion present. Seidel exam negative.     Slit lamp exam:    Right eye: No corneal ulcer, foreign body, hyphema or photophobia.     Visual Fields: Right eye visual fields normal.      Comments: Appearance of right eye. There is noted conjunctival injection. No icterus. Watery discharge/tearing.  PEERL intact. EOMI without nystagmus. No photophobia or consensual photophobia.  Corneal Abrasion Exam VCO. Risks, benefits and alternatives explained. 2 drops of tetracaine (PONTOCAINE) 0.5 % ophthalmic solution  were applied to the right eye. Fluorescein 1 MG ophthalmic strip applied the the surface of the right eye Wood's lamp used to screen for abrasion. There is noted increase fluorescein uptake as indicated in picture above. No corneal ulcer. Negative Seidel sign. No foreign bodies noted. No visible hyphema.  Eye flushed with sterile saline Patient tolerated the procedure well   Pulmonary:     Effort: Pulmonary effort is normal. No respiratory distress.  Skin:    Coloration: Skin is not pale.     Comments: No vesicular-like rash in the V1 distribution  Neurological:     Mental Status: He is alert.     Comments: Speech clear. Follows commands. No facial droop. PERRLA. EOM grossly intact. CN III-XII grossly intact. Grossly moves all extremities 4 without ataxia. Able and appropriate strength for age to upper and lower extremities bilaterally      ED Treatments / Results  Labs (all labs ordered are listed, but only abnormal results are displayed) Labs Reviewed - No data to display  EKG None  Radiology No results found.  Procedures Procedures (including critical care time)  Medications Ordered in ED Medications  erythromycin ophthalmic ointment 1 application (has no administration in time range)  fluorescein  ophthalmic strip 1 strip (1 strip Both Eyes Given 10/21/18 1039)  tetracaine (PONTOCAINE) 0.5 % ophthalmic solution 2 drop (2 drops Both Eyes Given 10/21/18 1039)     Initial Impression / Assessment and Plan / ED Course  I have reviewed the triage vital signs and the nursing notes.  Pertinent labs & imaging results that were available during my care of the patient were reviewed by me and considered in my medical decision making (see chart for details).     37 y.o. male presenting with right eye pain.   Pt with corneal abrasion on PE. Tetanus is up to date. Eye irrigated w NS, no evidence of FB.  Pt is not a contact lens  wearer.  Exam non-concerning for orbital cellulitis, hyphema, corneal ulcers. Patient will be discharged home with erythromycin.   Patient understands to follow up with ophthalmology in the next 2 days. He is to return to ER if new symptoms develop including change in vision, purulent drainage, entrapment or worsening symptoms. Return precautions discussed.  Final Clinical Impressions(s) / ED Diagnoses   Final diagnoses:  Abrasion of right cornea, initial encounter    ED Discharge Orders    None       Princella Pellegrini 10/21/18 1057    Raeford Razor, MD 10/22/18 1702

## 2018-10-21 NOTE — Discharge Instructions (Signed)
Use erythromycin ointment every 4 hours for the next 5-7 days. Make sure to wear protective eyewear at work. Follow up with the eye doctor (ophthalmologist) in the next 2 days. Follow attached handout. If you develop worsening or new concerning symptoms you can return to the emergency department for re-evaluation.

## 2018-10-21 NOTE — ED Notes (Signed)
Pt verbalized understanding to use antibiotic eye drops every 4 hours for the next 7 days and to follow up with eye dr via interpreter. VSS, NAD.

## 2022-09-21 ENCOUNTER — Other Ambulatory Visit: Payer: Self-pay

## 2022-09-21 ENCOUNTER — Emergency Department (HOSPITAL_COMMUNITY): Payer: Self-pay

## 2022-09-21 ENCOUNTER — Emergency Department (HOSPITAL_COMMUNITY)
Admission: EM | Admit: 2022-09-21 | Discharge: 2022-09-21 | Disposition: A | Discharge status: Home / Self Care | Payer: Self-pay | Attending: Emergency Medicine | Admitting: Emergency Medicine

## 2022-09-21 ENCOUNTER — Encounter (HOSPITAL_COMMUNITY): Payer: Self-pay | Admitting: Pharmacy Technician

## 2022-09-21 DIAGNOSIS — M542 Cervicalgia: Secondary | ICD-10-CM | POA: Insufficient documentation

## 2022-09-21 DIAGNOSIS — R29898 Other symptoms and signs involving the musculoskeletal system: Secondary | ICD-10-CM

## 2022-09-21 DIAGNOSIS — M6281 Muscle weakness (generalized): Secondary | ICD-10-CM | POA: Insufficient documentation

## 2022-09-21 LAB — CBC WITH DIFFERENTIAL/PLATELET
Abs Immature Granulocytes: 0.02 10*3/uL (ref 0.00–0.07)
Basophils Absolute: 0 10*3/uL (ref 0.0–0.1)
Basophils Relative: 1 %
Eosinophils Absolute: 0.1 10*3/uL (ref 0.0–0.5)
Eosinophils Relative: 1 %
HCT: 44.7 % (ref 39.0–52.0)
Hemoglobin: 14.9 g/dL (ref 13.0–17.0)
Immature Granulocytes: 0 %
Lymphocytes Relative: 20 %
Lymphs Abs: 1.2 10*3/uL (ref 0.7–4.0)
MCH: 30.8 pg (ref 26.0–34.0)
MCHC: 33.3 g/dL (ref 30.0–36.0)
MCV: 92.5 fL (ref 80.0–100.0)
Monocytes Absolute: 0.4 10*3/uL (ref 0.1–1.0)
Monocytes Relative: 7 %
Neutro Abs: 4 10*3/uL (ref 1.7–7.7)
Neutrophils Relative %: 71 %
Platelets: 208 10*3/uL (ref 150–400)
RBC: 4.83 MIL/uL (ref 4.22–5.81)
RDW: 12.4 % (ref 11.5–15.5)
WBC: 5.7 10*3/uL (ref 4.0–10.5)
nRBC: 0 % (ref 0.0–0.2)

## 2022-09-21 LAB — BASIC METABOLIC PANEL
Anion gap: 7 (ref 5–15)
BUN: 11 mg/dL (ref 6–20)
CO2: 24 mmol/L (ref 22–32)
Calcium: 9.2 mg/dL (ref 8.9–10.3)
Chloride: 107 mmol/L (ref 98–111)
Creatinine, Ser: 0.83 mg/dL (ref 0.61–1.24)
GFR, Estimated: >60 mL/min (ref >60)
Glucose, Bld: 109 mg/dL — ABNORMAL HIGH (ref 70–99)
Potassium: 3.8 mmol/L (ref 3.5–5.1)
Sodium: 138 mmol/L (ref 135–145)

## 2022-09-21 LAB — MAGNESIUM: Magnesium: 2.2 mg/dL (ref 1.7–2.4)

## 2022-09-21 MED ORDER — IOHEXOL 350 MG/ML SOLN
75.0000 mL | Freq: Once | INTRAVENOUS | Status: AC | PRN
  Administered 2022-09-21: 75 mL via INTRAVENOUS

## 2022-09-21 NOTE — ED Triage Notes (Signed)
Pt here with reports of weakness and numbness to L arm from elbow down to his hand for several days. States he might have hurt that arm while he was carrying something. Pt also with some pain to his neck. No other neuro deficits noted.

## 2022-09-21 NOTE — Discharge Instructions (Signed)
MRI of the cervical spine somewhat inconclusive.  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  Make an appointment to follow-up with hand surgery for further evaluation.  We will give you a sling to use in the meantime.  In addition there was concerns about a possible oral lesion that would need follow-up.  Follow-up with ear nose and throat for further evaluation of that.

## 2022-09-21 NOTE — Progress Notes (Signed)
Orthopedic Tech Progress Note Patient Details:  Tristan Owens [birth year] [id number]  Ortho Devices Type of Ortho Device: Sling immobilizer Ortho Device/Splint Location: LUE Ortho Device/Splint Interventions: Ordered, Application, Adjustment   Post Interventions Patient Tolerated: Well Instructions Provided: Care of device, Adjustment of device  Tristan Owens [name] 09/21/2022, 6:33 PM

## 2022-09-21 NOTE — ED Notes (Signed)
Called pt x3 for vitals, no response. 

## 2022-09-21 NOTE — ED Provider Notes (Signed)
[facility] EMERGENCY DEPARTMENT Provider Note   CSN: [id number] Arrival date & time: 09/21/22  3235     History  Chief Complaint  Patient presents with   Weakness    Tristan Owens is a 41 y.o. male.  Interpreted used.  Patient Spanish-speaking.  Patient states that he has had left arm weakness from the elbow down since about a week ago.  Denies any injury.  Seems to be weak to wrist extension and seems to be greater weakness to the fourth and fifth fingers of the left hand.  Also states that there is numbness.  Associated with some mild neck pain.  Patient seen and screened in triage.  CT head and neck was ordered.  Which raise concerns for some peritracheal air.  Which led to his CT angio and CT neck with contrast.  That raised some concern about may be an oral lesion but not big concern for any of the tracheal abnormalities.  Patient really has no anterior neck or chest complaints.  Patient works as a [employer]ud[employer].  Patient seems to deny falling asleep on the arm for long period of time or passing out.  Past medical history significant for alcohol use 12 cans of beer per week said knee surgery on the right knee in the past otherwise past medical history noncontributory.  Has not been seen in the emergency department recently.  No fevers.  Blood pressure 127/83 oxygen sats 100% on room air.       Home Medications Prior to Admission medications   Medication Sig Start Date End Date Taking? Authorizing Provider  acyclovir (ZOVIRAX) 400 MG tablet Take 1 tablet (400 mg total) by mouth 5 (five) times daily. 11/04/15   Charlynne Pander, MD  amoxicillin (AMOXIL) 500 MG capsule Take 1 capsule (500 mg total) by mouth 2 (two) times daily. 05/14/13   Renne Crigler, PA-C  amoxicillin-clavulanate (AUGMENTIN) 875-125 MG per tablet Take 1 tablet by mouth 2 (two) times daily. 11/30/13   Santiago Glad, PA-C  cetirizine-pseudoephedrine (ZYRTEC-D) 5-120 MG per tablet Take 1  tablet by mouth 2 (two) times daily. 05/14/13   Renne Crigler, PA-C  hydroxypropyl methylcellulose / hypromellose (ISOPTO TEARS / GONIOVISC) 2.5 % ophthalmic solution Place 1 drop into the left eye 3 (three) times daily as needed for dry eyes. 11/04/15   Charlynne Pander, MD  predniSONE (DELTASONE) 20 MG tablet Take 60 mg daily x 3 days then 40 mg daily x 3 days then 20 mg daily x 3 days 11/04/15   Charlynne Pander, MD  Pseudoephedrine-DM-GG (ROBITUSSIN CF PO) Take by mouth every 6 (six) hours as needed.    [provider]      Allergies    Patient has no known allergies.    Review of Systems   Review of Systems  Constitutional:  Negative for chills and fever.  HENT:  Negative for rhinorrhea and sore throat.   Eyes:  Negative for visual disturbance.  Respiratory:  Negative for cough and shortness of breath.   Cardiovascular:  Negative for chest pain and leg swelling.  Gastrointestinal:  Negative for abdominal pain, diarrhea, nausea and vomiting.  Genitourinary:  Negative for dysuria.  Musculoskeletal:  Negative for back pain and neck pain.  Skin:  Negative for rash.  Neurological:  Positive for weakness and numbness. Negative for dizziness, light-headedness and headaches.  Hematological:  Does not bruise/bleed easily.  Psychiatric/Behavioral:  Negative for confusion.     Physical Exam Updated Vital Signs BP  120/84   Pulse (!) 58   Temp 98.2 F (36.8 C) (Oral)   Resp 15   SpO2 100%  Physical Exam Vitals and nursing note reviewed.  Constitutional:      General: He is not in acute distress.    Appearance: Normal appearance. He is well-developed.  HENT:     Head: Normocephalic and atraumatic.  Eyes:     Extraocular Movements: Extraocular movements intact.     Conjunctiva/sclera: Conjunctivae normal.     Pupils: Pupils are equal, round, and reactive to light.  Cardiovascular:     Rate and Rhythm: Normal rate and regular rhythm.     Heart sounds: No murmur  heard. Pulmonary:     Effort: Pulmonary effort is normal. No respiratory distress.     Breath sounds: Normal breath sounds.  Abdominal:     Palpations: Abdomen is soft.     Tenderness: There is no abdominal tenderness.  Musculoskeletal:        General: No swelling, tenderness, deformity or signs of injury.     Cervical back: Neck supple. No rigidity or tenderness.     Comments: Patient with definite weakness to the left upper extremity.  But has good strength at the shoulder area.  Some weakness at the elbow with significant weakness to extension at the wrist and to the fourth and fifth fingers.  May be some weakness to the thumb index finger and middle finger.  Seems to involve an area above carpal tunnel area.  Could be just an ulnar nerve neuropathy.  Good cap refill.  Radial pulse 2+.  No swelling to the joints no tenderness with range of motion.  Whether passive or active.  Skin:    General: Skin is warm and dry.     Capillary Refill: Capillary refill takes less than 2 seconds.  Neurological:     Mental Status: He is alert.     Cranial Nerves: No cranial nerve deficit.     Sensory: Sensory deficit present.     Motor: Weakness present.  Psychiatric:        Mood and Affect: Mood normal.     ED Results / Procedures / Treatments   Labs (all labs ordered are listed, but only abnormal results are displayed) Labs Reviewed  BASIC METABOLIC PANEL - Abnormal; Notable for the following components:      Result Value   Glucose, Bld 109 (*)    All other components within normal limits  CBC WITH DIFFERENTIAL/PLATELET  MAGNESIUM    EKG None  Radiology MR Cervical Spine Wo Contrast  Result Date: 09/21/2022 CLINICAL DATA:  Neck pain and upper extremity weakness. EXAM: MRI CERVICAL SPINE WITHOUT CONTRAST TECHNIQUE: Multiplanar, multisequence MR imaging of the cervical spine was performed. No intravenous contrast was administered. COMPARISON:  CT scan 09/21/2022 FINDINGS: Alignment: Loss  of the normal cervical lordosis, which can be associated with muscle spasm. 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 No significant vertebral marrow edema is identified. Cord: No significant abnormal spinal cord signal is observed. Posterior Fossa, vertebral arteries, paraspinal tissues: Small caliber right vertebral artery is shown on CT of the neck. Disc levels: C2-3: Unremarkable. C3-4: Mild central narrowing of the thecal sac due to short pedicles and central disc protrusion. C4-5: Mild central narrowing of the thecal sac due to short pedicles and central disc protrusion. C5-6: Mild central narrowing of the thecal sac due to central disc protrusion and short pedicles. C6-7: Unremarkable C7-T1: Unremarkable T1-2: Unremarkable IMPRESSION: 1. Mild  central narrowing of the thecal sac at C3-4, C4-5, and C5-6 due to short pedicles and central disc protrusions. 2. Loss of the normal cervical lordosis, which can be associated with muscle spasm. Electronically Signed   By: Gaylyn Rong M.D.   On: 09/21/2022 17:43   CT Soft Tissue Neck W Contrast  Result Date: 09/21/2022 CLINICAL DATA:  Soft tissue swelling, infection suspected, neck xray done EXAM: CT NECK WITH CONTRAST TECHNIQUE: Multidetector CT imaging of the neck was performed using the standard protocol following the bolus administration of intravenous contrast. RADIATION DOSE REDUCTION: This exam was performed according to the departmental dose-optimization program which includes automated exposure control, adjustment of the mA and/or kV according to patient size and/or use of iterative reconstruction technique. CONTRAST:  75mL OMNIPAQUE IOHEXOL 350 MG/ML SOLN COMPARISON:  None Available. FINDINGS: Pharynx and larynx: Subtle/equivocal enhancement involving the tongue base with mild asymmetric right glossotonsillar fullness (for example see series 1, image 53). Closed glottis limits assessment of the larynx. Unremarkable epiglottis.  Salivary glands: No inflammation, mass, or stone. Thyroid: Normal. Lymph nodes: None enlarged or abnormal density. Vascular: Limited assessment due to non arterial timing. Major arteries in the neck appear grossly patent. Limited intracranial: Negative. Visualized orbits: Negative. Mastoids and visualized paranasal sinuses: Paranasal sinus mucosal thickening, greatest in the right ethmoid and right maxillary sinus. No mastoid effusions. Skeleton: No acute findings. Upper chest: Small calcified granuloma in the right upper lobe with mild scarring. Otherwise, prior visualized lung apices are clear. IMPRESSION: Subtle/equivocal enhancement involving the tongue base with mild asymmetric right glossotonsillar fullness. This could potentially represent artifact or infection; however, malignancy is not excluded and correlation with direct inspection is recommended. Electronically Signed   By: Feliberto Harts M.D.   On: 09/21/2022 13:48   CT Chest W Contrast  Result Date: 09/21/2022 CLINICAL DATA:  Follow-up on CT cervical spine, tracheal or esophageal diverticulum. EXAM: CT CHEST WITH CONTRAST TECHNIQUE: Multidetector CT imaging of the chest was performed during intravenous contrast administration. RADIATION DOSE REDUCTION: This exam was performed according to the departmental dose-optimization program which includes automated exposure control, adjustment of the mA and/or kV according to patient size and/or use of iterative reconstruction technique. CONTRAST:  75mL OMNIPAQUE IOHEXOL 350 MG/ML SOLN COMPARISON:  CT cervical spine performed earlier on the same date. FINDINGS: Cardiovascular: No significant vascular findings. Normal heart size. No pericardial effusion. Mediastinum/Nodes: There are small pockets of air right posterosuperior to the trachea measuring approximately 0.8 x 0.3 cm with possible small tract leading to the trachea. There are no inflammatory changes or evidence of mediastinal inflammation or  pneumomediastinum. No enlarged mediastinal, hilar, or axillary lymph nodes. Thyroid gland, trachea, and esophagus demonstrate no significant findings. Lungs/Pleura: Mild atelectasis in the posterior aspect of the right upper lobe. Lungs are otherwise clear without evidence of acute pulmonary process. No suspicious pulmonary nodule. Upper Abdomen: No acute abnormality. Musculoskeletal: Mild thoracic spondylosis. No acute osseous abnormality. IMPRESSION: 1. Small pockets of air right posterosuperior to the trachea measuring approximately 0.8 x 0.3 cm with possible small tract leading to the trachea. 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 Differential also includes esophageal diverticulum, however less likely. No inflammatory changes to suggest mediastinitis or pneumomediastinum. 2. Mild atelectasis in the posterior aspect of the right upper lobe. Lungs are otherwise clear without evidence of acute pulmonary process. 3. Mild thoracic spondylosis. Electronically Signed   By: Larose Hires D.O.   On: 09/21/2022 13:39   CT Cervical Spine Wo Contrast  Result Date: 09/21/2022 CLINICAL DATA:  Neck pain, numbness and weakness in left arm EXAM: CT CERVICAL SPINE WITHOUT CONTRAST TECHNIQUE: Multidetector CT imaging of the cervical spine was performed without intravenous contrast. Multiplanar CT image reconstructions were also generated. RADIATION DOSE REDUCTION: This exam was performed according to the departmental dose-optimization program which includes automated exposure control, adjustment of the mA and/or kV according to patient size and/or use of iterative reconstruction technique. COMPARISON:  None Available. FINDINGS: Alignment: Alignment of posterior margins of vertebral bodies is unremarkable. There is mild levoscoliosis. Skull base and vertebrae: No recent fracture is seen. Small smooth marginated calcification is noted posterior to the spinous process of C5 vertebra suggesting ligament  calcification from previous injury. Small anterior bony spurs are noted at C4-C5 and C5-C6 levels. Soft tissues and spinal canal: There is no central spinal stenosis. Disc levels: There is no significant disc space narrowing. There is no significant narrowing of neural foramina. Upper chest: There are few small pockets of air posterior to the right side trachea in superior mediastinum. Other: There is mucosal thickening in ethmoid and maxillary sinuses. IMPRESSION: No acute findings are seen. No recent fracture is seen. There is no significant spinal stenosis or significant encroachment of neural foramina. Degenerative changes are noted with small anterior bony spurs at C4-C5 and C5-C6 levels. There is small cluster of loculated pockets of air posterior to the right side of trachea in superior mediastinum. This may suggest diverticula arising from the trachea or esophagus. Less likely possibility would be inflammatory process such as mediastinitis. There is no significant adjacent inflammatory change. Please correlate with patient's symptoms and consider CT chest if warranted. Chronic sinusitis. Electronically Signed   By: Ernie Avena M.D.   On: 09/21/2022 09:38   CT HEAD WO CONTRAST ( )  Result Date: 09/21/2022 CLINICAL DATA:  Weakness and left arm numbness EXAM: CT HEAD WITHOUT CONTRAST TECHNIQUE: Contiguous axial images were obtained from the base of the skull through the vertex without intravenous contrast. RADIATION DOSE REDUCTION: This exam was performed according to the departmental dose-optimization program which includes automated exposure control, adjustment of the mA and/or kV according to patient size and/or use of iterative reconstruction technique. COMPARISON:  None Available. FINDINGS: Brain: No evidence of acute infarction, hemorrhage, hydrocephalus, extra-axial collection or mass lesion/mass effect. Vascular: No hyperdense vessel or unexpected calcification. Skull: Normal. Negative  for fracture or focal lesion. Sinuses/Orbits: No acute finding. Partial opacification of bilateral ethmoid sinuses. Other: None. IMPRESSION: No acute intracranial abnormality. Electronically Signed   By: Jacob Moores M.D.   On: 09/21/2022 09:15    Procedures Procedures    Medications Ordered in ED Medications  iohexol (OMNIPAQUE) 350 MG/ML injection 75 mL (75 mLs Intravenous Contrast Given 09/21/22 1311)    ED Course/ Medical Decision Making/ A&P                           Medical Decision Making Amount and/or Complexity of Data Reviewed Radiology: ordered.  Clinically felt that this may be more of peripheral nerve problem.  But got MRI neck just to make sure no obvious abnormality there.  MRI neck with some narrowing but not sure if that is enough to explain the symptoms.  Patient has no symptoms to the right upper extremity none to the lower extremities.  Work-up otherwise did raise some concern about an oral lesion we will send him to ear nose and throat for that.  Also we will have  him follow-up with hand surgery for nerve conduction testing.  Patient will be given a sling in the meantime.  Symptoms of already been ongoing for a week.  Final Clinical Impression(s) / ED Diagnoses Final diagnoses:  Left arm weakness    Rx / DC Orders ED Discharge Orders     None         Vanetta Mulders, MD 09/21/22 [phone]

## 2022-09-21 NOTE — ED Provider Triage Note (Signed)
Emergency Medicine Provider Triage Evaluation Note  Tristan Owens , a 41 y.o. male  was evaluated in triage.  Pt complains of upper extremity weakness and numbness.  Noted a few days ago. No facial droop, difficulty word findings. 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 Some neck pain as well. No fever. Initially numbness started at entire hand and is now just primal to the elbow. Cannot make a fist. No redness or warmth.  Review of Systems  Positive: Left UE weakness, numbness Negative: Redness, warmth  Physical Exam  There were no vitals taken for this visit. Gen:   Awake, no distress   Resp:  Normal effort  MSK:   Wrist drop to left wrist, decreased grip strength, decreased strength at elbow. Able to lift shoulder over head. Decreased sensation to entire hand, forearm. Other:    Medical Decision Making  Medically screening exam initiated at 8:51 AM.  Appropriate orders placed.  Tristan Owens was informed that the remainder of the evaluation will be completed by another provider, this initial triage assessment does not replace that evaluation, and the importance of remaining in the ED until their evaluation is complete.  LUE weakness  No code stroke, No LVO  Neuro deficits to the entire arm mid humerus distally, don't think carpal tunnel. No obvious infectious process, 2+ pulses BIL. Question cervical etiology? Given soe neck pain however full ROM to neck without difficulty, no no midline tenderness. No respiratory involvement. No tetanus exposure.   Stormey Wilborn A, PA-C 09/21/22 [phone]

## 2022-09-30 ENCOUNTER — Other Ambulatory Visit: Payer: Self-pay

## 2022-09-30 DIAGNOSIS — R202 Paresthesia of skin: Secondary | ICD-10-CM

## 2022-10-06 ENCOUNTER — Ambulatory Visit (INDEPENDENT_AMBULATORY_CARE_PROVIDER_SITE_OTHER): Payer: Self-pay | Admitting: Neurology

## 2022-10-06 DIAGNOSIS — G5632 Lesion of radial nerve, left upper limb: Secondary | ICD-10-CM

## 2022-10-06 DIAGNOSIS — R202 Paresthesia of skin: Secondary | ICD-10-CM

## 2022-10-06 NOTE — Procedures (Signed)
[facility] Neurology  [address] [place], [address]  [place], [place] [address] Tel: [phone] Fax: [phone] Test Date:  10/06/2022  Patient: Tristan Owens DOB: 1981/05/02 Physician: Jacquelyne Balint, MD  Sex: Male Height: ' 0" Ref Phys: Weber Cooks, MD  ID#: [id number]   Technician:    History: This is a 41 year old male with left wrist drop and numbness in left hand after a fall about 1 month ago.  NCV & EMG Findings: Extensive electrodiagnostic evaluation of the left upper limb with additional nerve conduction studies of the right upper limb shows: Left median, left ulnar, and bilateral radial sensory responses are within normal limits. Left median (APB), left ulnar (ADM), left radial (EDC), and right radial (EIP and EDC) motor responses are within normal limits. The left radial (EIP) motor response shows prolonged distal onset latency (3.1 ms). The left extensor indicis proprius, left extensor digitorum communis, and left brachioradialis show active denervation changes and reduced recruitment without chronic motor axon loss changes. All other tested muscles are normal with normal motor unit configuration and recruitment patterns.  Impression: This is an abnormal electrodiagnostic evaluation. 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 Findings may be consistent with demyelination or early axon loss, and are severe in degree electrically. Follow up electrodiagnostic study in 2 months may help determine extent of axon loss. There is currently no slowing or conduction block across the spinal groove to suggest this localization, though this is a common localization for the needle examination findings. Neuromuscular ultrasound may also help further localize if clinically indicated. No electrodiagnostic evidence of a left cervical (C5-C8) motor  radiculopathy. Screening studies for left median, left ulnar, and right radial mononeuropathies are normal.   ___________________________ Jacquelyne Balint, MD    Nerve Conduction Studies Motor Nerve Results    Latency Amplitude F-Lat Segment Distance CV Comment  Site (ms) Norm (mV) Norm (ms)  (cm) (m/s) Norm   Left Median (APB) Motor  Wrist 3.0  < 3.9 6.1  > 6.0        Elbow 8.0 - 5.4 -  Elbow-Wrist 26 52  > 50   Left Radial (EDC) Motor  Antecub fossa 1.75  < 3.1 10.7  > 6.0        Below groove 2.5 - 10.5 -  Antecub fossa-below groove 8 107  > 50   Above groove 3.4 - 8.0 -  Below groove-above groove 9 100 -   Right Radial (EDC) Motor  Antecub fossa 2.1  < 3.1 9.7  > 6.0        Left Radial (EIP) Motor  Antecub fossa *3.1  < 2.9 4.8  > 2.0        Below groove 4.4 - 4.6 -  Antecub fossa-below groove 8 62  > 49   Above groove 5.5 - 3.5 -  Below groove-above groove 10 91 -   Right Radial (EIP) Motor  Antecub fossa 2.8  < 2.9 6.9  > 2.0        Left Ulnar (ADM) Motor  Wrist 1.98  < 3.1 15.2  > 7.0 25.5       Bel elbow 5.7 - 14.5 -  Bel elbow-Wrist 22 59  > 50   Ab elbow 7.3 - 14.2 -  Ab elbow-Bel elbow 10 63 -    Sensory Sites    Neg Peak Lat Amplitude (O-P) Segment Distance Velocity  Comment  Site (ms) Norm (V) Norm  (cm) (ms)   Left Median Sensory  Wrist-Dig II 3.1  < 3.4 20  > 20 Wrist-Dig II 13    Left Radial Sensory  Forearm-Wrist 2.1  < 2.7 27  > 18 Forearm-Wrist 10    Right Radial Sensory  Forearm-Wrist 2.2  < 2.7 24  > 18 Forearm-Wrist 10    Left Ulnar Sensory  Wrist-Dig V 2.5  < 3.1 20  > 12 Wrist-Dig V 11     Electromyography   Side Muscle Ins.Act Fibs Fasc Recrt Amp Dur Poly Activation Comment  Left FDI Nml Nml Nml Nml Nml Nml Nml Nml N/A  Left EIP Nml *2+ Nml *3- Nml Nml *1+ Nml N/A  Left ED Nml *2+ Nml *3- Nml Nml Nml Nml N/A  Left FPL Nml Nml Nml Nml Nml Nml Nml Nml N/A  Left Pronator teres Nml Nml Nml Nml Nml Nml Nml Nml N/A  Left Brachiorad Nml *1+ Nml *SMU  Nml Nml Nml Nml N/A  Left Biceps Nml Nml Nml Nml Nml Nml Nml Nml N/A  Left Triceps Nml Nml Nml Nml Nml Nml Nml Nml N/A  Left Deltoid Nml Nml Nml Nml Nml Nml Nml Nml N/A      Waveforms:  Motor               Sensory           F-Wave

## 2023-03-09 ENCOUNTER — Emergency Department (HOSPITAL_COMMUNITY)
Admission: EM | Admit: 2023-03-09 | Discharge: 2023-03-09 | Disposition: A | Discharge status: Home / Self Care | Payer: Self-pay | Attending: Emergency Medicine | Admitting: Emergency Medicine

## 2023-03-09 ENCOUNTER — Encounter (HOSPITAL_COMMUNITY): Payer: Self-pay | Admitting: Emergency Medicine

## 2023-03-09 ENCOUNTER — Other Ambulatory Visit: Payer: Self-pay

## 2023-03-09 DIAGNOSIS — M7918 Myalgia, other site: Secondary | ICD-10-CM | POA: Insufficient documentation

## 2023-03-09 DIAGNOSIS — R1011 Right upper quadrant pain: Secondary | ICD-10-CM | POA: Insufficient documentation

## 2023-03-09 DIAGNOSIS — R109 Unspecified abdominal pain: Secondary | ICD-10-CM

## 2023-03-09 LAB — CBC WITH DIFFERENTIAL/PLATELET
Abs Immature Granulocytes: 0.01 10*3/uL (ref 0.00–0.07)
Basophils Absolute: 0 10*3/uL (ref 0.0–0.1)
Basophils Relative: 1 %
Eosinophils Absolute: 0.1 10*3/uL (ref 0.0–0.5)
Eosinophils Relative: 1 %
HCT: 40.3 % (ref 39.0–52.0)
Hemoglobin: 13.9 g/dL (ref 13.0–17.0)
Immature Granulocytes: 0 %
Lymphocytes Relative: 32 %
Lymphs Abs: 1.8 10*3/uL (ref 0.7–4.0)
MCH: 30.3 pg (ref 26.0–34.0)
MCHC: 34.5 g/dL (ref 30.0–36.0)
MCV: 88 fL (ref 80.0–100.0)
Monocytes Absolute: 0.4 10*3/uL (ref 0.1–1.0)
Monocytes Relative: 7 %
Neutro Abs: 3.3 10*3/uL (ref 1.7–7.7)
Neutrophils Relative %: 59 %
Platelets: 185 10*3/uL (ref 150–400)
RBC: 4.58 MIL/uL (ref 4.22–5.81)
RDW: 12.6 % (ref 11.5–15.5)
WBC: 5.6 10*3/uL (ref 4.0–10.5)
nRBC: 0 % (ref 0.0–0.2)

## 2023-03-09 LAB — COMPREHENSIVE METABOLIC PANEL
ALT: 28 U/L (ref 0–44)
AST: 24 U/L (ref 15–41)
Albumin: 4 g/dL (ref 3.5–5.0)
Alkaline Phosphatase: 94 U/L (ref 38–126)
Anion gap: 10 (ref 5–15)
BUN: 12 mg/dL (ref 6–20)
CO2: 23 mmol/L (ref 22–32)
Calcium: 9.1 mg/dL (ref 8.9–10.3)
Chloride: 103 mmol/L (ref 98–111)
Creatinine, Ser: 0.77 mg/dL (ref 0.61–1.24)
GFR, Estimated: >60 mL/min (ref >60)
Glucose, Bld: 107 mg/dL — ABNORMAL HIGH (ref 70–99)
Potassium: 3.7 mmol/L (ref 3.5–5.1)
Sodium: 136 mmol/L (ref 135–145)
Total Bilirubin: 0.7 mg/dL (ref 0.3–1.2)
Total Protein: 6.9 g/dL (ref 6.5–8.1)

## 2023-03-09 LAB — URINALYSIS, ROUTINE W REFLEX MICROSCOPIC
Bilirubin Urine: NEGATIVE
Glucose, UA: NEGATIVE mg/dL
Hgb urine dipstick: NEGATIVE
Ketones, ur: NEGATIVE mg/dL
Leukocytes,Ua: NEGATIVE
Nitrite: NEGATIVE
Protein, ur: NEGATIVE mg/dL
Specific Gravity, Urine: 1.008 (ref 1.005–1.030)
pH: 5 (ref 5.0–8.0)

## 2023-03-09 LAB — LIPASE, BLOOD: Lipase: 42 U/L (ref 11–51)

## 2023-03-09 MED ORDER — KETOROLAC TROMETHAMINE 15 MG/ML IJ SOLN
15.0000 mg | Freq: Once | INTRAMUSCULAR | Status: AC
  Administered 2023-03-09: 15 mg via INTRAVENOUS
  Filled 2023-03-09: qty 1

## 2023-03-09 MED ORDER — METHOCARBAMOL 500 MG PO TABS: 500.0000 mg | ORAL_TABLET | Freq: Two times a day (BID) | ORAL | 0 refills | Status: AC

## 2023-03-09 MED ORDER — METHOCARBAMOL 500 MG PO TABS
750.0000 mg | ORAL_TABLET | Freq: Once | ORAL | Status: AC
  Administered 2023-03-09: 750 mg via ORAL
  Filled 2023-03-09: qty 2

## 2023-03-09 NOTE — ED Provider Notes (Signed)
[place] EMERGENCY DEPARTMENT AT [facility] Provider Note  Medical Decision Making   HPI: Latwan Luchsinger is a 42 y.o. male with no perinent history who presents complaining of right-sided abdominal pain. Patient arrived via POV accompanied by girlfriend.  History provided by patient.  Spanish teleinterpreter utilized for this visit, Charlcie Cradle.  Patient reports that he has pain in his right side along his right costal margin for approximately 2 to 3 days.  No specific precipitating factor, no alleviating factors, he has not taken any medication at home for this condition.  Reports that pain is somewhat worse with movement.  Denies any fevers, chills, chest pain, shortness of breath, nausea, vomiting, diarrhea, dysuria.  Reports that he has been tolerating p.o. at baseline.  Eating does not worsen pain.  Defecation worsens pain if he uses his abdominal muscles to strain, however otherwise no pain with bowel movements or urination.  Denies any rash, denies known history of chickenpox as a child.  ROS: As per HPI. Please see MAR for complete past medical history, surgical history, and social history.   Physical exam is pertinent for some tenderness to palpation on the right lateral costal margin, negative Murphy sign, negative CVA tenderness, normoactive bowel sounds, vitally stable..   The differential includes but is not limited to musculoskeletal pain, pyelonephritis, nephrolithiasis, cystitis, bowel obstruction, cholecystitis, cholelithiasis/biliary colic, pneumonia, shingles.  Additional history obtained from: None External records from outside source obtained and reviewed including: None  ED provider interpretation of ECG: Not indicated  ED provider interpretation of radiology/imaging: Not indicated  Labs ordered were interpreted by myself as well as my attending and were incorporated into the medical decision making process for this patient.  ED provider interpretation  of labs: UA without UTI or blood.  Lipase WNL.  CBC without leukocytosis, anemia, thrombocytopenia.  CMP without AKI or emergent electrolyte derangement  Interventions: Toradol, Robaxin  See the EMR for full details regarding lab and imaging results. Overall well-appearing on exam, has some muscular tenderness to palpation.  Doubt shingles given patient without any rash any burning pain, pain not in a dermatomal distribution.  Doubt pneumonia given patient without fever, cough, leukocytosis, no focal findings on lung exam.  Doubt cholecystitis or biliary colic given patient with negative Murphy sign, pain not worsened by food.  Doubt bowel obstruction given patient has normoactive bowel sounds, is tolerating p.o., having bowel movements.  Doubt cystitis, pyelonephritis given no UTI on UA, no CVA tenderness.  Doubt nephrolithiasis given no CVA tenderness no blood in urine.  Overall CBC is also reassuring against infection given no leukocytosis.  Given constant pain, worsened with palpation and movement, suspect musculoskeletal etiology, administered Toradol and Robaxin with resolution of pain in the ED, discussed this with the patient and girlfriend at bedside, recommended Tylenol, ibuprofen, stretching, as well as short outpatient course of Robaxin, paper prescription provided, patient does not have PCP, therefore referral placed for family medicine follow-up.     Consults: Not indicated  Disposition: DISCHARGE: I believe that the patient is safe for discharge home with outpatient follow-up. Patient was informed of all pertinent physical exam, laboratory, and imaging findings. Patient's suspected etiology of their symptom presentation was discussed with the patient and all questions were answered. We discussed following up with PCP. I provided thorough ED return precautions. The patient feels safe and comfortable with this plan.  The plan for this patient was discussed with Dr. Hyacinth Meeker, who voiced  agreement and who oversaw evaluation and treatment of  this patient.  Clinical Impression:  1. Right sided abdominal pain   2. Musculoskeletal pain    Discharge  Therapies: These medications and interventions were provided for the patient while in the ED. Medications  methocarbamol (ROBAXIN) tablet 750 mg (750 mg Oral Given 03/09/23 1938)  ketorolac (TORADOL) 15 MG/ML injection 15 mg (15 mg Intravenous Given 03/09/23 1940)    MDM generated using voice dictation software and may contain dictation errors.  Please contact me for any clarification or with any questions.  Clinical Complexity A medically appropriate history, review of systems, and physical exam was performed.  Collateral history obtained from: None I personally reviewed the labs, EKG, imaging as discussed above. Patient's presentation is most consistent with acute complicated illness / injury requiring diagnostic workup Considered and ruled out life and body threatening conditions  Treatment: Outpatient follow-up Medications: Prescription Discussed patient's care with providers from the following different specialties: Not indicated  Physical Exam   ED Triage Vitals  Enc Vitals Group     BP 03/09/23 1823 119/84     Pulse Rate 03/09/23 1823 79     Resp 03/09/23 1823 18     Temp 03/09/23 1823 98.2 F (36.8 C)     Temp Source 03/09/23 1823 Oral     SpO2 03/09/23 1823 97 %     Weight 03/09/23 1821 178 lb 9.2 oz (81 kg)     Height 03/09/23 1821 5\' 8"  (1.727 m)     Head Circumference --      Peak Flow --      Pain Score 03/09/23 1820 5     Pain Loc --      Pain Edu? --      Excl. in GC? --      Physical Exam Vitals and nursing note reviewed.  Constitutional:      General: He is not in acute distress.    Appearance: He is well-developed.  HENT:     Head: Normocephalic and atraumatic.  Eyes:     Conjunctiva/sclera: Conjunctivae normal.  Cardiovascular:     Rate and Rhythm: Normal rate and regular rhythm.      Heart sounds: No murmur heard. Pulmonary:     Effort: Pulmonary effort is normal. No respiratory distress.     Breath sounds: Normal breath sounds.  Abdominal:     Palpations: Abdomen is soft.     Tenderness: There is abdominal tenderness (Upper abdomen,/costal margin on lateral aspect of right side, see diagram). There is no right CVA tenderness, left CVA tenderness, guarding or rebound. Negative signs include Murphy's sign, Rovsing's sign, McBurney's sign, psoas sign and obturator sign.  Musculoskeletal:        General: No swelling.     Cervical back: Neck supple.       Back:  Skin:    General: Skin is warm and dry.     Capillary Refill: Capillary refill takes less than 2 seconds.  Neurological:     Mental Status: He is alert.  Psychiatric:        Mood and Affect: Mood normal.       Procedure Note  Procedures  No orders to display    Julianne Rice, MD Emergency Medicine, PGY-2   Curley Spice, MD 03/09/23 2055    Eber Hong, MD 03/10/23 [phone]

## 2023-03-09 NOTE — Discharge Instructions (Addendum)
Tristan Owens  Gracias por permitirnos cuidar de usted hoy.  Usted acudi hoy a Urgencias porque lleva 2 o 3 das con dolor en el lado derecho del abdomen. Aqu en el departamento de emergencias, le empujamos el abdomen y siente algo de dolor que se Occupational psychologist en los msculos y huesos de la parte derecha del costado. No tiene ninguna revisin de sus nmeros de rin, pncreas, hgado, su recuento de glbulos blancos, que es el [address] de infeccin/inflamacin en su sangre, tampoco est elevado. Tambin puedes comer y beber bien en casa. Debido a todos Advertising copywriter, no sentimos que su dolor est relacionado con la inflamacin o infeccin de ninguno de sus rganos internos, le dimos algunos analgsicos y Restaurant manager, fast food aqu en el departamento de emergencias y su dolor mejor, creemos que Lo ms probable es que su dolor est relacionado con la inflamacin/tensin de sus msculos y [place].  Le recetaremos un relajante muscular, el mismo que tom aqu en el departamento de emergencias. Puede tomarlo [employer] al da segn sea necesario para Chief Technology Officer. Tambin puede tomar Tylenol e ibuprofeno cada 4 a 6 horas segn sea necesario para Human resources officer. Puede tomarlos juntos o alternarlos cada 3 horas. Estos medicamentos estn disponibles sin receta.  Tambin le daremos una derivacin para un mdico de atencin primaria, es importante que todos tengan un mdico de atencin primaria para la atencin a Air cabin crew.  Regrese al [employer] de Emergencias o llame al 911 si sus sntomas empeoran o no mejoran, dolor en el pecho, dificultad para respirar, dolor intenso o que empeora significativamente, fiebre alta, confusin grave, desmayo o si tiene algn motivo para hacerlo. cree que necesita atencin mdica de Associate Professor.  Esperamos que te sientas mejor pronto.  Curley Spice, MD Departamento de Medicina de Emergencia Salud del cono de [place]

## 2023-03-09 NOTE — ED Triage Notes (Signed)
Using interpreter, pt endorses abd pain to right side for 2 days. Denies n/v/d. Reports that about 6 months ago he fell but unsure if it is related. Denies any urinary symptoms.

## 2023-06-17 ENCOUNTER — Encounter (HOSPITAL_COMMUNITY): Payer: Self-pay

## 2023-06-17 ENCOUNTER — Emergency Department (HOSPITAL_COMMUNITY)
Admission: EM | Admit: 2023-06-17 | Discharge: 2023-06-17 | Disposition: A | Discharge status: Home / Self Care | Payer: Self-pay | Attending: Emergency Medicine | Admitting: Emergency Medicine

## 2023-06-17 DIAGNOSIS — G510 Bell's palsy: Secondary | ICD-10-CM | POA: Insufficient documentation

## 2023-06-17 MED ORDER — PREDNISONE 20 MG PO TABS: ORAL_TABLET | ORAL | 0 refills | Status: AC

## 2023-06-17 MED ORDER — FLUORESCEIN SODIUM 1 MG OP STRP
1.0000 | ORAL_STRIP | Freq: Once | OPHTHALMIC | Status: AC
  Administered 2023-06-17: 1 via OPHTHALMIC
  Filled 2023-06-17: qty 1

## 2023-06-17 MED ORDER — HYPROMELLOSE (GONIOSCOPIC) 2.5 % OP SOLN: 1.0000 [drp] | Freq: Three times a day (TID) | OPHTHALMIC | 1 refills | Status: AC | PRN

## 2023-06-17 MED ORDER — ACYCLOVIR 400 MG PO TABS: 400.0000 mg | ORAL_TABLET | Freq: Every day | ORAL | 0 refills | Status: AC

## 2023-06-17 MED ORDER — TETRACAINE HCL 0.5 % OP SOLN
1.0000 [drp] | Freq: Once | OPHTHALMIC | Status: AC
  Administered 2023-06-17: 1 [drp] via OPHTHALMIC
  Filled 2023-06-17: qty 4

## 2023-06-17 NOTE — ED Triage Notes (Signed)
Pt states 1 week ago that the left side of his face would not function and was paralyzed. Left eye will not blink, it is drooped compared to the right, has no ability to move the facial muscles on the left side. Pt has no other neurological symptoms present.

## 2023-06-17 NOTE — Discharge Instructions (Signed)
You have bell's palsy.  This is paralysis of the facial nerves.  See attached for more information. Take the prescribed medication as directed. Follow-up with  Return to the ED for new or worsening symptoms.

## 2023-06-17 NOTE — ED Notes (Signed)
Toothache with lt facial swelling for 2-3 days

## 2023-06-17 NOTE — ED Provider Notes (Signed)
[place] EMERGENCY DEPARTMENT AT [facility] Provider Note   CSN: [id number] Arrival date & time: 06/17/23  2105     History  Chief Complaint  Patient presents with   facial paralysis    Tristan Owens is a 42 y.o. male.  The history is provided by the patient and medical records.   42 year old male with no significant past medical history presenting to the ED for left facial droop.  States for the past week he feels like the left side of his face is paralyzed.  Feels like his left eye will not blink normally and is drooping compared to his right eye.  He does state his vision is slightly blurred in this eye.  He denies any numbness or weakness of his arms or legs.  He does not have any difficulty walking.  He denies any confusion, dizziness, headaches.  He does have history of Bell's palsy in the past and states it felt like this.  Home Medications Prior to Admission medications   Medication Sig Start Date End Date Taking? Authorizing Provider  acyclovir (ZOVIRAX) 400 MG tablet Take 1 tablet (400 mg total) by mouth 5 (five) times daily. 11/04/15   Charlynne Pander, MD  amoxicillin (AMOXIL) 500 MG capsule Take 1 capsule (500 mg total) by mouth 2 (two) times daily. 05/14/13   Renne Crigler, PA-C  amoxicillin-clavulanate (AUGMENTIN) 875-125 MG per tablet Take 1 tablet by mouth 2 (two) times daily. 11/30/13   Santiago Glad, PA-C  cetirizine-pseudoephedrine (ZYRTEC-D) 5-120 MG per tablet Take 1 tablet by mouth 2 (two) times daily. 05/14/13   Renne Crigler, PA-C  hydroxypropyl methylcellulose / hypromellose (ISOPTO TEARS / GONIOVISC) 2.5 % ophthalmic solution Place 1 drop into the left eye 3 (three) times daily as needed for dry eyes. 11/04/15   Charlynne Pander, MD  methocarbamol (ROBAXIN) 500 MG tablet Take 1 tablet (500 mg total) by mouth 2 (two) times daily. 03/09/23   Curley Spice, MD  predniSONE (DELTASONE) 20 MG tablet Take 60 mg daily x 3 days then 40 mg daily  x 3 days then 20 mg daily x 3 days 11/04/15   Charlynne Pander, MD  Pseudoephedrine-DM-GG (ROBITUSSIN CF PO) Take by mouth every 6 (six) hours as needed.    [provider]      Allergies    Patient has no known allergies.    Review of Systems   Review of Systems  Neurological:  Positive for facial asymmetry.  All other systems reviewed and are negative.   Physical Exam Updated Vital Signs BP 124/89   Pulse 78   Temp 98.4 F (36.9 C) (Oral)   Resp 16   SpO2 100%   Physical Exam Vitals and nursing note reviewed.  Constitutional:      Appearance: He is well-developed.  HENT:     Head: Normocephalic and atraumatic.  Eyes:     Conjunctiva/sclera: Conjunctivae normal.     Pupils: Pupils are equal, round, and reactive to light.     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     Heart sounds: Normal heart sounds.  Pulmonary:     Effort: Pulmonary effort is normal.     Breath sounds: Normal breath sounds.  Abdominal:     General: Bowel sounds are normal.  Palpations: Abdomen is soft.  Musculoskeletal:        General: Normal range of motion.     Cervical back: Normal range of motion.  Skin:    General: Skin is warm and dry.  Neurological:     Mental Status: He is alert and oriented to person, place, and time.     Comments: AAOx3, answering questions and following commands without difficulty, moving arms/legs well, no strength deficit appreciated, obvious left facial droop involving forehead, normal sensation throughout including face     ED Results / Procedures / Treatments   Labs (all labs ordered are listed, but only abnormal results are displayed) Labs Reviewed - No data to display  EKG None  Radiology No results  found.  Procedures Procedures    Medications Ordered in ED Medications  tetracaine (PONTOCAINE) 0.5 % ophthalmic solution 1 drop (1 drop Left Eye Given by Other 06/17/23 2227)  fluorescein ophthalmic strip 1 strip (1 strip Left Eye Given 06/17/23 2227)    ED Course/ Medical Decision Making/ A&P                                 Medical Decision Making Risk OTC drugs. Prescription drug management.   41 year old male here with 1 week of left-sided facial droop.  He is having difficulty closing his left eye/blinking.  History of Bell's palsy in the past affecting same side and reports this feels similar.  He is awake, alert, oriented.  He has obvious left-sided facial droop involving the forehead.  His left eye is slightly drooped compared with the right and he is having difficulty closing this fully.  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  Suspect his reported blurred vision is due to dry eyes.  He has no other neurologic symptoms, no subjective weakness or decreased sensation on exam.  Exam is consistent with Bell's palsy, do not suspect acute stroke.  He will be treated with acyclovir, steroids, and Lacrilube for left eye.  Encouraged to return here for any new/acute changes.  Final Clinical Impression(s) / ED Diagnoses Final diagnoses:  Bell's palsy    Rx / DC Orders ED Discharge Orders          Ordered    hydroxypropyl methylcellulose / hypromellose (ISOPTO TEARS / GONIOVISC) 2.5 % ophthalmic solution  3 times daily PRN        06/17/23 2234    acyclovir (ZOVIRAX) 400 MG tablet  5 times daily        06/17/23 2234    predniSONE (DELTASONE) 20 MG tablet        06/17/23 2234              Garlon Hatchet, PA-C 06/17/23 2254    Rondel Baton, MD 06/18/23 [phone]

## 2023-08-27 ENCOUNTER — Other Ambulatory Visit: Payer: Self-pay

## 2023-08-27 ENCOUNTER — Emergency Department (HOSPITAL_COMMUNITY): Payer: Self-pay

## 2023-08-27 ENCOUNTER — Emergency Department (HOSPITAL_COMMUNITY)
Admission: EM | Admit: 2023-08-27 | Discharge: 2023-08-27 | Disposition: A | Discharge status: Home / Self Care | Payer: Self-pay | Attending: Emergency Medicine | Admitting: Emergency Medicine

## 2023-08-27 ENCOUNTER — Encounter (HOSPITAL_COMMUNITY): Payer: Self-pay

## 2023-08-27 DIAGNOSIS — S0083XA Contusion of other part of head, initial encounter: Secondary | ICD-10-CM

## 2023-08-27 DIAGNOSIS — S0012XA Contusion of left eyelid and periocular area, initial encounter: Secondary | ICD-10-CM | POA: Insufficient documentation

## 2023-08-27 DIAGNOSIS — S0101XA Laceration without foreign body of scalp, initial encounter: Secondary | ICD-10-CM | POA: Insufficient documentation

## 2023-08-27 DIAGNOSIS — S01511A Laceration without foreign body of lip, initial encounter: Secondary | ICD-10-CM | POA: Insufficient documentation

## 2023-08-27 DIAGNOSIS — Y92410 Unspecified street and highway as the place of occurrence of the external cause: Secondary | ICD-10-CM | POA: Insufficient documentation
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
## 2023-08-27 NOTE — ED Notes (Signed)
[place] spanish interpretor used to assist with discharge, pt verbalizes understanding. Discharged home with family. NAD. Ambulatory
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
## 2023-08-30 NOTE — Plan of Care (Signed)
[facility] Tonsillectomy/Adenoidectomy, Postoperative PEDS care plan entered in error.

## 2023-09-08 ENCOUNTER — Encounter (HOSPITAL_COMMUNITY): Payer: Self-pay

## 2023-09-08 ENCOUNTER — Ambulatory Visit (HOSPITAL_COMMUNITY)
Admission: EM | Admit: 2023-09-08 | Discharge: 2023-09-08 | Disposition: A | Discharge status: Home / Self Care | Payer: Self-pay | Attending: Internal Medicine | Admitting: Internal Medicine

## 2023-09-08 DIAGNOSIS — H539 Unspecified visual disturbance: Secondary | ICD-10-CM
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
## 2023-09-08 NOTE — Discharge Instructions (Addendum)
You need to see an eye doctor as soon as possible.  Today to schedule the appointment

## 2023-09-09 ENCOUNTER — Other Ambulatory Visit: Payer: Self-pay

## 2023-09-09 ENCOUNTER — Emergency Department (HOSPITAL_COMMUNITY)
Admission: EM | Admit: 2023-09-09 | Discharge: 2023-09-10 | Disposition: A | Discharge status: Home / Self Care | Payer: Self-pay | Attending: Emergency Medicine | Admitting: Emergency Medicine

## 2023-09-09 ENCOUNTER — Emergency Department (HOSPITAL_COMMUNITY): Payer: Self-pay

## 2023-09-09 DIAGNOSIS — R072 Precordial pain: Secondary | ICD-10-CM | POA: Insufficient documentation

## 2023-09-09 LAB — TROPONIN I (HIGH SENSITIVITY)
Troponin I (High Sensitivity): 7 ng/L (ref <18)
Troponin I (High Sensitivity): 7 ng/L (ref <18)

## 2023-09-09 LAB — CBC
HCT: 41.8 % (ref 39.0–52.0)
Hemoglobin: 13.7 g/dL (ref 13.0–17.0)
MCH: 29.1 pg (ref 26.0–34.0)
MCHC: 32.8 g/dL (ref 30.0–36.0)
MCV: 88.7 fL (ref 80.0–100.0)
Platelets: 228 10*3/uL (ref 150–400)
RBC: 4.71 MIL/uL (ref 4.22–5.81)
RDW: 12.3 % (ref 11.5–15.5)
WBC: 6.4 10*3/uL (ref 4.0–10.5)
nRBC: 0 % (ref 0.0–0.2)

## 2023-09-09 LAB — BASIC METABOLIC PANEL
Anion gap: 11 (ref 5–15)
BUN: 18 mg/dL (ref 6–20)
CO2: 20 mmol/L — ABNORMAL LOW (ref 22–32)
Calcium: 9.1 mg/dL (ref 8.9–10.3)
Chloride: 107 mmol/L (ref 98–111)
Creatinine, Ser: 0.69 mg/dL (ref 0.61–1.24)
GFR, Estimated: >60 mL/min (ref >60)
Glucose, Bld: 122 mg/dL — ABNORMAL HIGH (ref 70–99)
Potassium: 3.9 mmol/L (ref 3.5–5.1)
Sodium: 138 mmol/L (ref 135–145)

## 2023-09-09 NOTE — ED Triage Notes (Signed)
Pt is coming in for chest pain that started 5hrs ago, it is accompanied with some left arm pain as well. Has had periods of nausea with the pain. He expresses the pain is a 5/10. No reported fevers, cough, or congestion but does express having a headache as well.

## 2023-09-10 MED ORDER — ALUM & MAG HYDROXIDE-SIMETH 200-200-20 MG/5ML PO SUSP
30.0000 mL | Freq: Once | ORAL | Status: AC
Start: 2023-09-10 — End: 2023-09-10
  Administered 2023-09-10: 30 mL via ORAL
  Filled 2023-09-10: qty 30
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
## 2023-12-31 ENCOUNTER — Other Ambulatory Visit: Payer: Self-pay

## 2023-12-31 ENCOUNTER — Emergency Department (HOSPITAL_COMMUNITY)
Admission: EM | Admit: 2023-12-31 | Discharge: 2023-12-31 | Disposition: A | Discharge status: Home / Self Care | Payer: Self-pay | Attending: Emergency Medicine | Admitting: Emergency Medicine

## 2023-12-31 ENCOUNTER — Encounter (HOSPITAL_COMMUNITY): Payer: Self-pay | Admitting: Emergency Medicine

## 2023-12-31 ENCOUNTER — Emergency Department (HOSPITAL_COMMUNITY): Payer: Self-pay

## 2023-12-31 DIAGNOSIS — R519 Headache, unspecified: Secondary | ICD-10-CM | POA: Insufficient documentation

## 2023-12-31 DIAGNOSIS — R079 Chest pain, unspecified: Secondary | ICD-10-CM | POA: Insufficient documentation

## 2023-12-31 LAB — CBC
HCT: 45.1 % (ref 39.0–52.0)
Hemoglobin: 14.9 g/dL (ref 13.0–17.0)
MCH: 29.2 pg (ref 26.0–34.0)
MCHC: 33 g/dL (ref 30.0–36.0)
MCV: 88.4 fL (ref 80.0–100.0)
Platelets: 302 10*3/uL (ref 150–400)
RBC: 5.1 MIL/uL (ref 4.22–5.81)
RDW: 13.3 % (ref 11.5–15.5)
WBC: 5.5 10*3/uL (ref 4.0–10.5)
nRBC: 0 % (ref 0.0–0.2)

## 2023-12-31 LAB — BASIC METABOLIC PANEL
Anion gap: 13 (ref 5–15)
BUN: 7 mg/dL (ref 6–20)
CO2: 23 mmol/L (ref 22–32)
Calcium: 9.6 mg/dL (ref 8.9–10.3)
Chloride: 105 mmol/L (ref 98–111)
Creatinine, Ser: 0.66 mg/dL (ref 0.61–1.24)
GFR, Estimated: >60 mL/min (ref >60)
Glucose, Bld: 103 mg/dL — ABNORMAL HIGH (ref 70–99)
Potassium: 4.1 mmol/L (ref 3.5–5.1)
Sodium: 141 mmol/L (ref 135–145)

## 2023-12-31 LAB — TROPONIN I (HIGH SENSITIVITY)
Troponin I (High Sensitivity): 6 ng/L (ref <18)
Troponin I (High Sensitivity): 9 ng/L (ref <18)

## 2023-12-31 MED ORDER — ALUM & MAG HYDROXIDE-SIMETH 200-200-20 MG/5ML PO SUSP
30.0000 mL | Freq: Once | ORAL | Status: AC
  Administered 2023-12-31: 30 mL via ORAL
  Filled 2023-12-31: qty 30

## 2023-12-31 MED ORDER — ACETAMINOPHEN 500 MG PO TABS
1000.0000 mg | ORAL_TABLET | Freq: Once | ORAL | Status: AC
  Administered 2023-12-31: 1000 mg via ORAL
  Filled 2023-12-31: qty 2

## 2023-12-31 NOTE — ED Provider Notes (Signed)
Tristan Owens ([facility]) Provider Note   CSN: [id number] Arrival date & time: 12/31/23  1326     History  Chief Complaint  Patient presents with   Chest Pain    Tristan Owens is a 43 y.o. male past medical history reporting to the emergency room chest pain.  Chest pain occurred approximately 3 hours ago.  Chest pain is now resolved.  Patient reports that he was sitting down when the started it was nonradiating and located in the Owens of his chest described as burning.  Patient reports his pain was very mild.  Pain improved after aspirin and nitro. Denies any exertional chest pain or exertional shortness of breath in the past.  Denies any palpitations, lightheadedness, dizziness.  Patient does report that after he took the nitro he started having headache. Has not seen eye doctor.  Reports he drinks alcohol occasionally.  Reports he is not a smoker.  No history of coronary artery disease.   Chest Pain      Home Medications Prior to Admission medications   Medication Sig Start Date End Date Taking? Authorizing Provider  acyclovir (ZOVIRAX) 400 MG tablet Take 1 tablet (400 mg total) by mouth 5 (five) times daily. 06/17/23   Garlon Hatchet, PA-C  amoxicillin (AMOXIL) 500 MG capsule Take 1 capsule (500 mg total) by mouth 2 (two) times daily. 05/14/13   Renne Crigler, PA-C  amoxicillin-clavulanate (AUGMENTIN) 875-125 MG per tablet Take 1 tablet by mouth 2 (two) times daily. 11/30/13   Santiago Glad, PA-C  cetirizine-pseudoephedrine (ZYRTEC-D) 5-120 MG per tablet Take 1 tablet by mouth 2 (two) times daily. 05/14/13   Renne Crigler, PA-C  hydroxypropyl methylcellulose / hypromellose (ISOPTO TEARS / GONIOVISC) 2.5 % ophthalmic solution Place 1 drop into the left eye 3 (three) times daily as needed for dry eyes. 06/17/23   Garlon Hatchet, PA-C  methocarbamol (ROBAXIN) 500 MG tablet Take 1 tablet (500 mg total) by mouth 2 (two) times daily. 03/09/23    Curley Spice, MD  predniSONE (DELTASONE) 20 MG tablet Take 60 mg daily x 3 days then 40 mg daily x 3 days then 20 mg daily x 3 days 06/17/23   Garlon Hatchet, PA-C  Pseudoephedrine-DM-GG (ROBITUSSIN CF PO) Take by mouth every 6 (six) hours as needed.    [provider]      Allergies    Patient has no known allergies.    Review of Systems   Review of Systems  Cardiovascular:  Positive for chest pain.    Physical Exam Updated Vital Signs BP 131/88 (BP Location: Right Arm)   Pulse (!) 103   Temp 98.3 F (36.8 C) (Oral)   Resp 20   Ht 5\' 4"  (1.626 m)   Wt 79.4 kg   SpO2 100%   BMI 30.04 kg/m  Physical Exam Vitals and nursing note reviewed.  Constitutional:      General: He is not in acute distress.    Appearance: He is not toxic-appearing.  HENT:     Head: Normocephalic and atraumatic.  Eyes:     General: No scleral icterus.    Conjunctiva/sclera: Conjunctivae normal.  Cardiovascular:     Rate and Rhythm: Normal rate and regular rhythm.     Pulses: Normal pulses.     Heart sounds: Normal heart sounds.  Pulmonary:     Effort: Pulmonary effort is normal. No respiratory distress.     Breath sounds: Normal breath sounds.  Abdominal:  General: Abdomen is flat. Bowel sounds are normal.     Palpations: Abdomen is soft.     Tenderness: There is no abdominal tenderness.  Musculoskeletal:     Right lower leg: No edema.     Left lower leg: No edema.  Skin:    General: Skin is warm and dry.     Findings: No lesion.  Neurological:     General: No focal deficit present.     Mental Status: He is alert and oriented to person, place, and time. Mental status is at baseline.     ED Results / Procedures / Treatments   Labs (all labs ordered are listed, but only abnormal results are displayed) Labs Reviewed  CBC  BASIC METABOLIC PANEL  TROPONIN I (HIGH SENSITIVITY)    EKG None  Radiology DG Chest Port 1 View Result Date: 12/31/2023 CLINICAL DATA:  Chest  pain. EXAM: PORTABLE CHEST 1 VIEW COMPARISON:  September 09, 2023. FINDINGS: The heart size and mediastinal contours are within normal limits. Both lungs are clear. The visualized skeletal structures are unremarkable. IMPRESSION: No active disease. Electronically Signed   By: Lupita Raider M.D.   On: 12/31/2023 14:47    Procedures Procedures    Medications Ordered in ED Medications - No data to display  ED Course/ Medical Decision Making/ A&P                                 Medical Decision Making Risk OTC drugs.   Yoshi Benigno-Morales 43 y.o. presented today for chest pain. Working DDx that I considered at this time includes, but not limited to, ACS, GERD, pe, pna, aortic dissection, pneumothorax, MSK path, anemia, esophageal rupture, CHF exacerbation, valvular disorder, myocarditis, pericarditis, endocarditis, pericardial effusion/cardiac tamponade, pulmonary edema, gastritis/PUD, esophagitis.  R/o Dx: PE: advanced imaging will not be ordered as alternative diagnoses are more likely at this time Aortic Dissection: less likely based on the history of present illness - location, quality, onset, and severity of symptoms in this case.   Review of prior external notes: 09/09/2023 visit  Unique Tests and My Interpretation:  EKG: Rate, rhythm, axis, intervals all examined: No acute changes since prior EKG Chest x-ray without any acute abnormality CBC without leukocytosis and no anemia.  BMP without significant electrolyte abnormality.  Troponins flat.    Problem List / ED Course / Critical interventions / Medication management  Patient reporting with chest pain.  Has two negative troponins thus doubt ACS.  He has had no acute change in EKG.  No unilateral swelling or calf tenderness thus do not suspect he has DVT.  No history of PE and no associated shortness of breath thus doubt PE.  Patient's symptoms not consistent with aortic dissection.  He has reassuring chest x-ray without  pneumothorax or pneumonia.  Since patient does have burning pain feel it is likely GERD, symptoms improved with GI cocktail.  Patient was hemodynamically stable throughout stay.  Patient is currently back to baseline without any chest pain.  Patient has low heart score.  Patient was slightly hypertensive during stay.  No known history of hyperlipidemia.  No history of coronary artery disease however will refer to cardiology due to second episode of chest pain. I ordered medication including cocktail for GERD like symptoms, Tylenol for headache Reevaluation of the patient after these medicines showed that the patient improved Patients vitals assessed. Upon arrival patient is hemodynamically stable.  I  have reviewed the patients home medicines and have made adjustments as needed    Plan:  Patient has low heart score.  Given that this is the second presentation of chest pain will refer to cardiology for further workup. Given return precautions.         Final Clinical Impression(s) / ED Diagnoses Final diagnoses:  Chest pain, unspecified type    Rx / DC Orders ED Discharge Orders          Ordered    Ambulatory referral to Cardiology       Comments: If you have not heard from the Cardiology office within the next 72 hours please call [phone].   12/31/23 1838              Cade Dashner, Horald Chestnut, PA-C 12/31/23 2136    Gerhard Munch, MD 12/31/23 [phone]

## 2023-12-31 NOTE — Discharge Instructions (Signed)
Please return to the emergency room if you have any new or worsening symptoms.  Please return if your chest pain returns.  Your workup today is reassuring however would like for you to follow-up with cardiology.  Cardiology should call to schedule an appointment.

## 2023-12-31 NOTE — ED Triage Notes (Signed)
From work denies medical HX, 1 hour ago CP started. Happened once before a month ago.
